# Patient Record
Sex: Female | Born: 1965 | Race: White | Hispanic: No | Marital: Married | Smoking: Never smoker
Health system: Southern US, Community
[De-identification: ages and names within clinical notes are randomized; demographics above are authoritative.]

## PROBLEM LIST (undated history)

## (undated) DIAGNOSIS — N920 Excessive and frequent menstruation with regular cycle: Secondary | ICD-10-CM

## (undated) HISTORY — PX: HYSTEROSCOPY: SHX211

## (undated) HISTORY — PX: DILATION AND CURETTAGE, DIAGNOSTIC / THERAPEUTIC: SUR384

## (undated) HISTORY — PX: LAPAROSCOPIC SUPRACERVICAL HYSTERECTOMY: SUR797

## (undated) HISTORY — DX: Excessive and frequent menstruation with regular cycle: N92.0

---

## 2005-12-21 ENCOUNTER — Emergency Department: Payer: Self-pay | Admitting: Emergency Medicine

## 2005-12-21 IMAGING — CR RIGHT ANKLE - COMPLETE 3+ VIEW
1 series · 5 of 5 positions shown · non-contrast
Comparison: none

REASON FOR EXAM: Fall
COMMENTS:

PROCEDURE:     DXR - DXR ANKLE RIGHT COMPLETE  - [DATE]  [DATE]
RESULT:          Views of the ankle reveal the joint mortise to be
preserved.  The talar dome is intact.  The medial, lateral, and posterior
malleoli appear intact.  There is soft tissue swelling laterally.

[Series 1: view not recorded · 0.17mm/px · 5 of 5 slices shown]
[im 1/5]
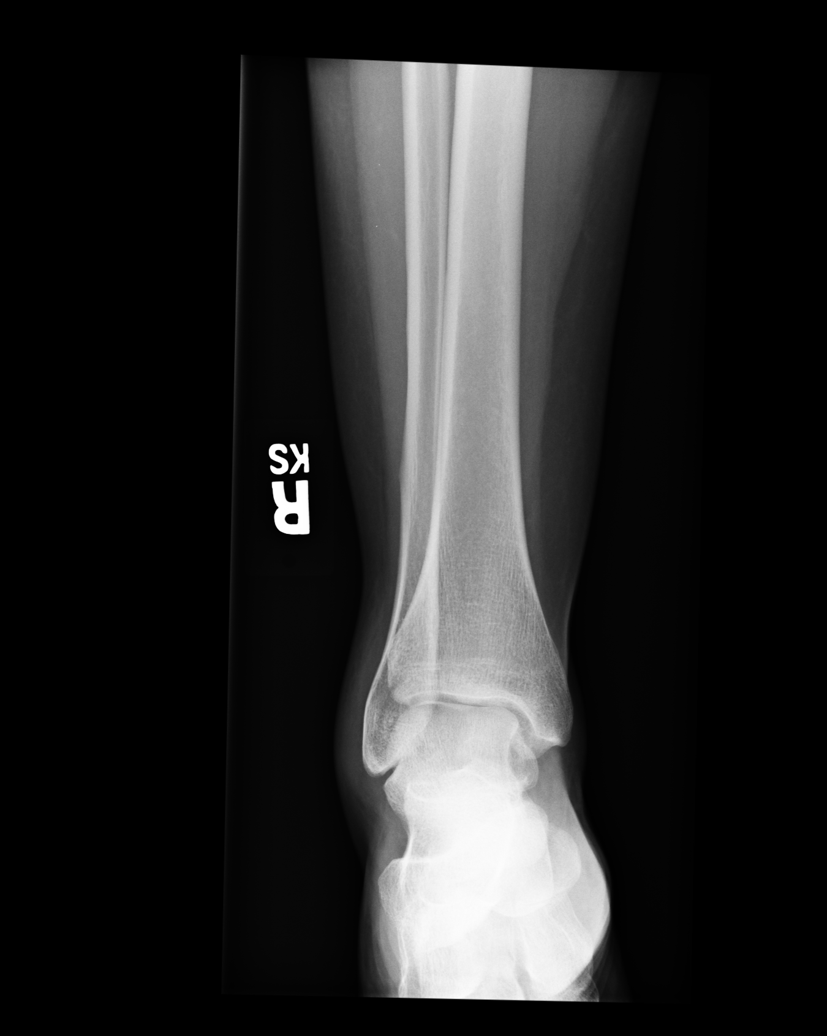
[im 2/5]
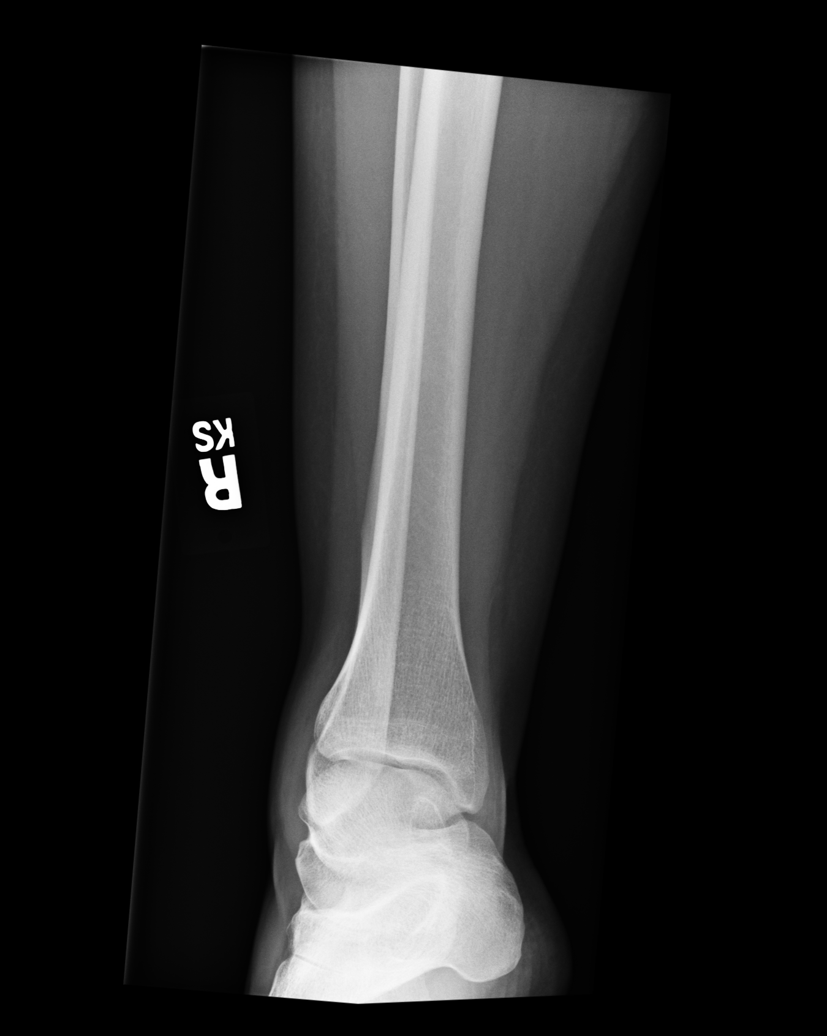
[im 3/5]
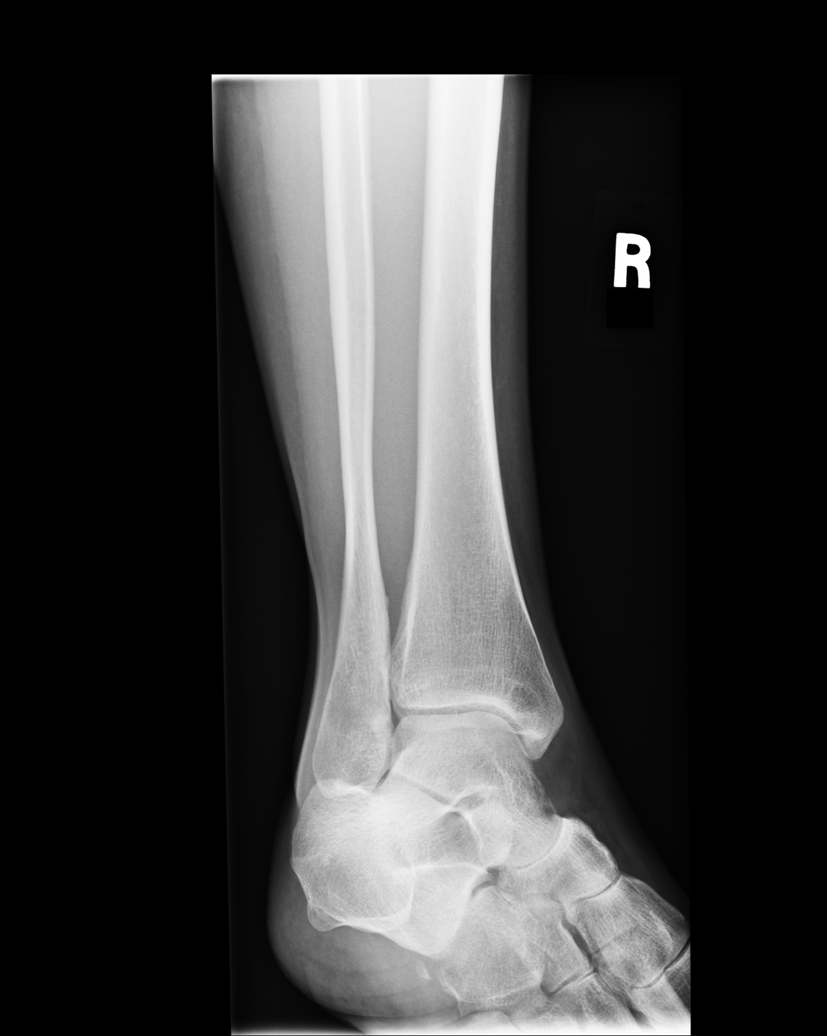
[im 4/5]
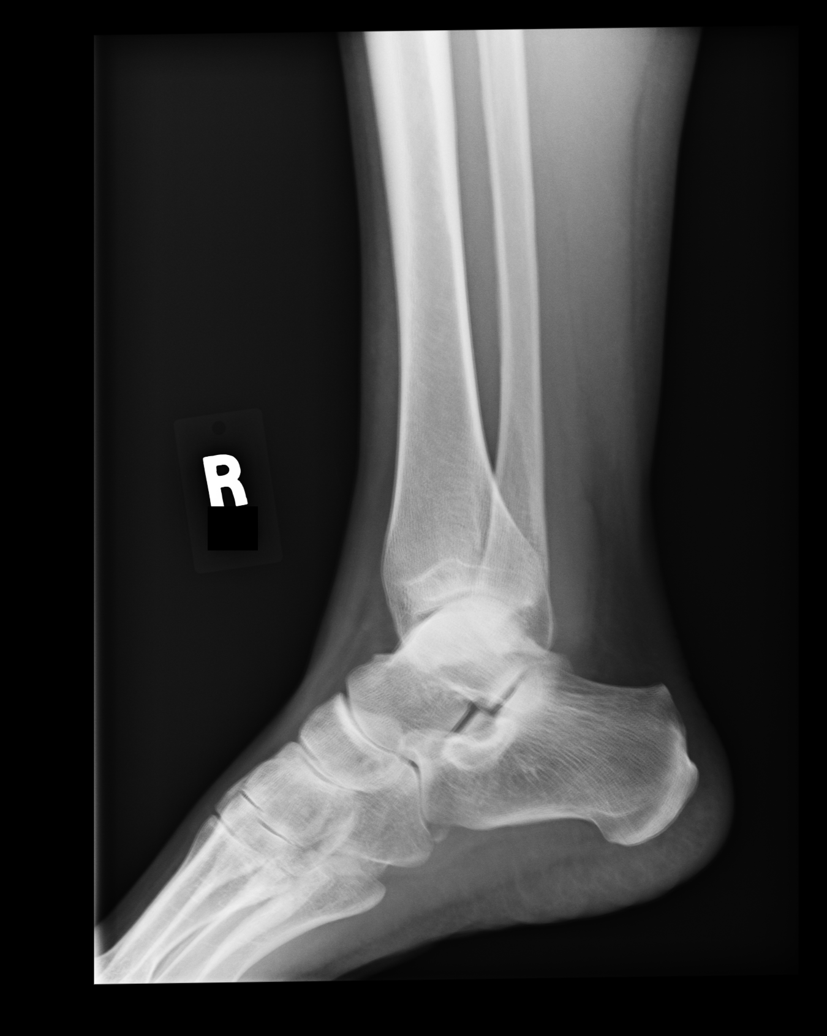
[im 5/5]
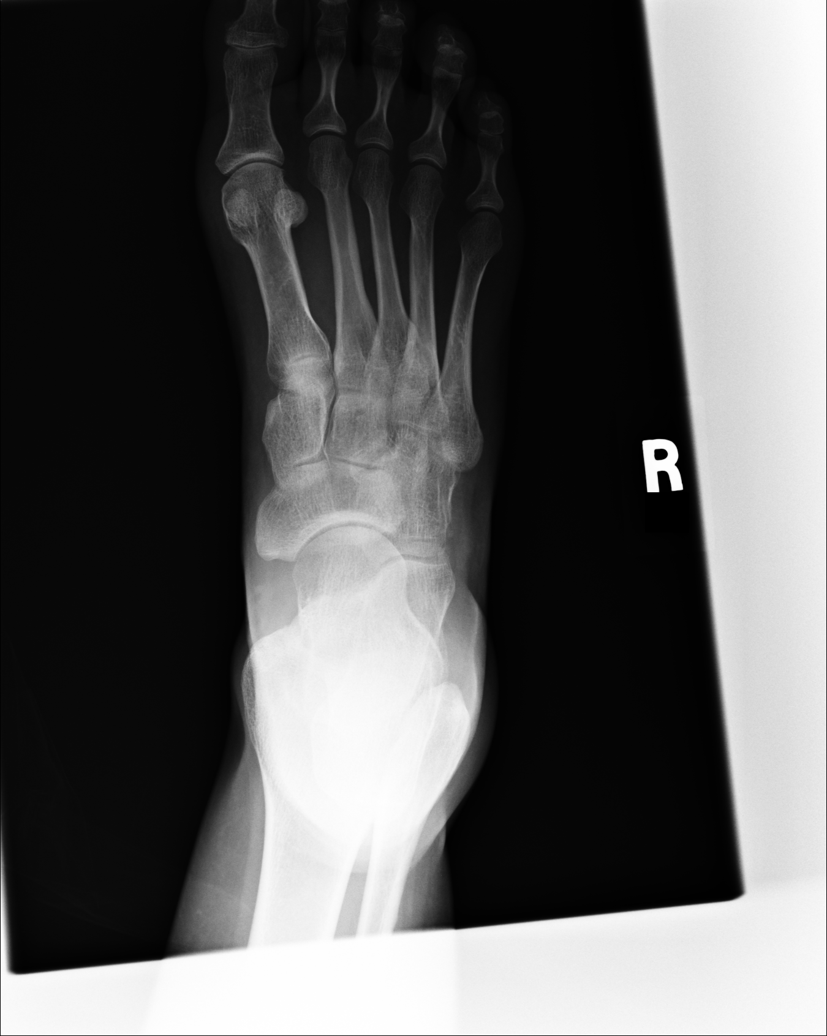

[5 of 5 positions shown; findings below may reference images not displayed]

IMPRESSION: There is evidence of soft tissue injury over the
lateral malleolus, but I do not see evidence of an acute fracture.

## 2006-12-02 ENCOUNTER — Ambulatory Visit: Payer: Self-pay

## 2007-04-02 ENCOUNTER — Ambulatory Visit: Payer: Self-pay

## 2007-04-10 ENCOUNTER — Ambulatory Visit: Payer: Self-pay

## 2010-05-30 ENCOUNTER — Ambulatory Visit: Payer: Self-pay

## 2011-06-04 ENCOUNTER — Ambulatory Visit: Payer: Self-pay

## 2011-06-04 IMAGING — MG MAM DGTL SCREENING MAMMO W/CAD
1 series · 4 of 4 positions shown · non-contrast
Comparison: none

REASON FOR EXAM: scr
COMMENTS:

[Series 2219: R CC · right · 4 of 4 slices shown]
[im 1/4]
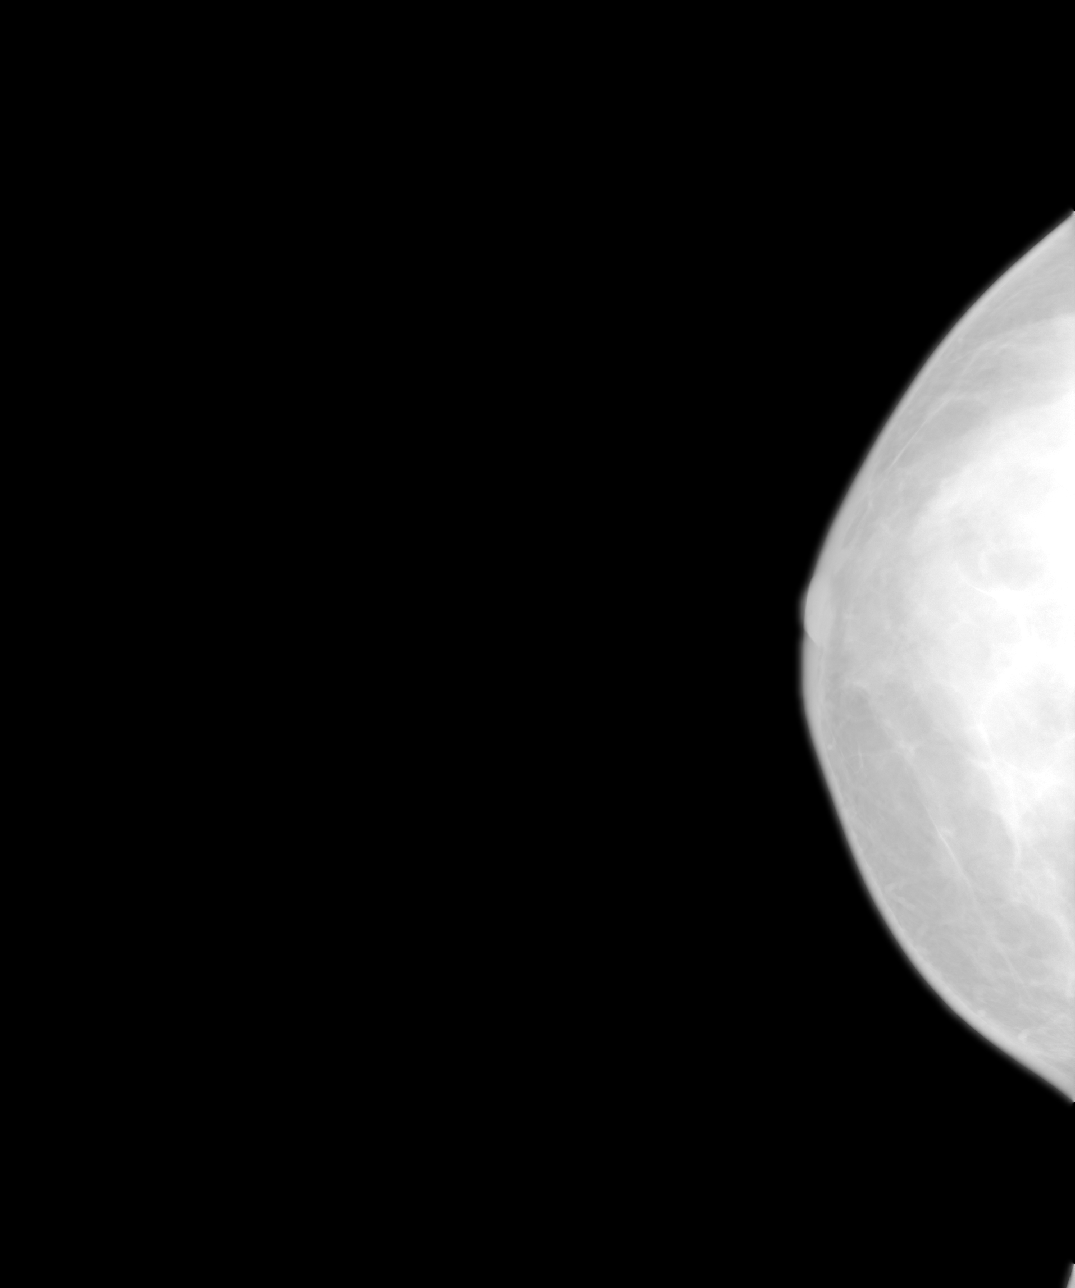
[im 2/4]
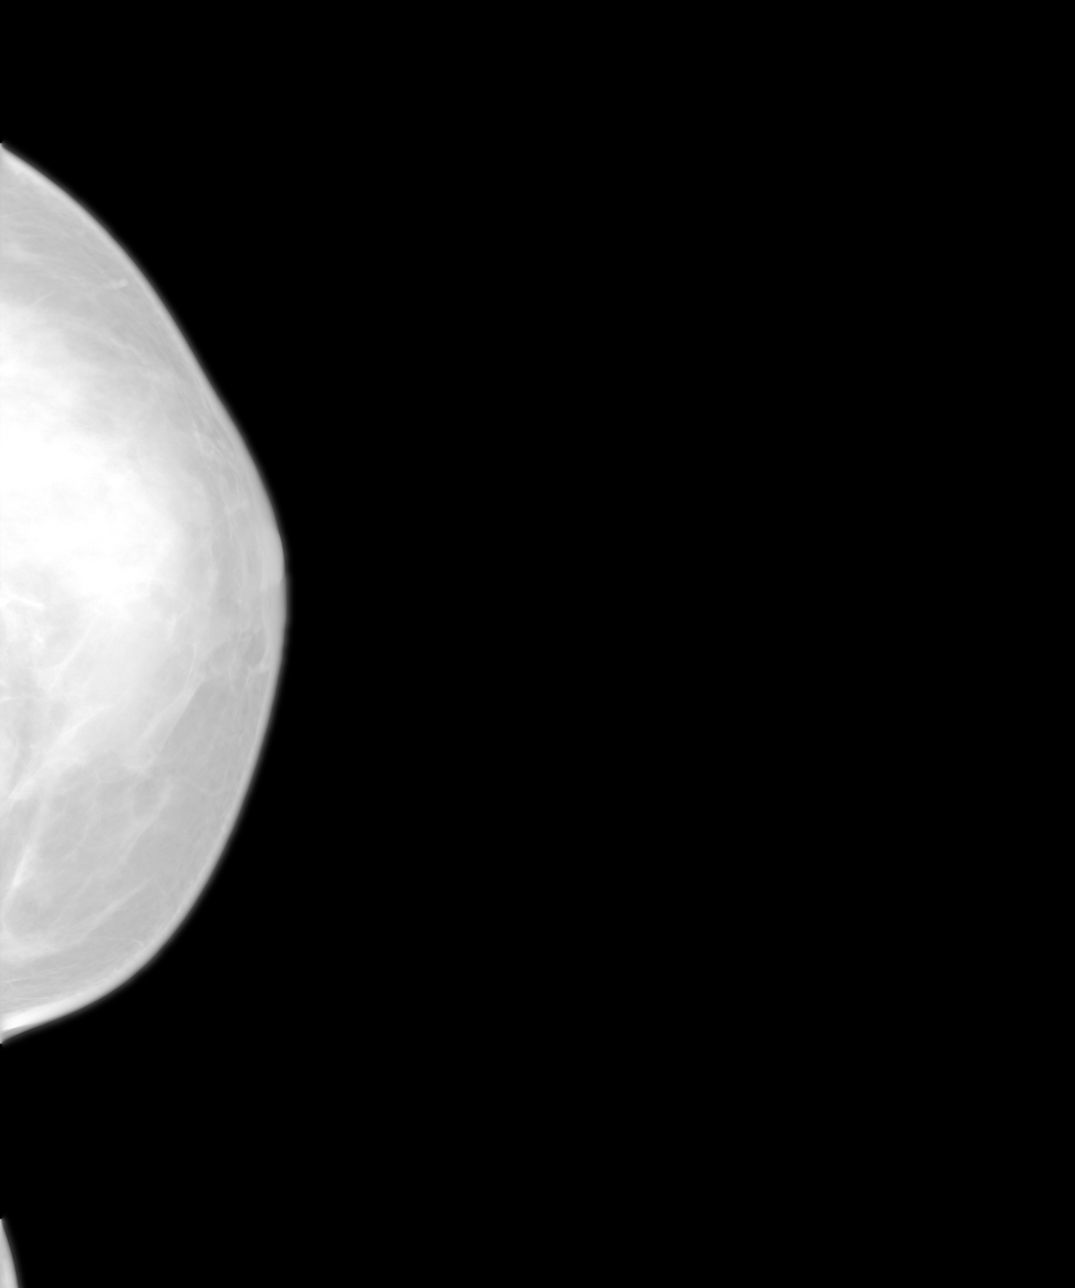
[im 3/4]
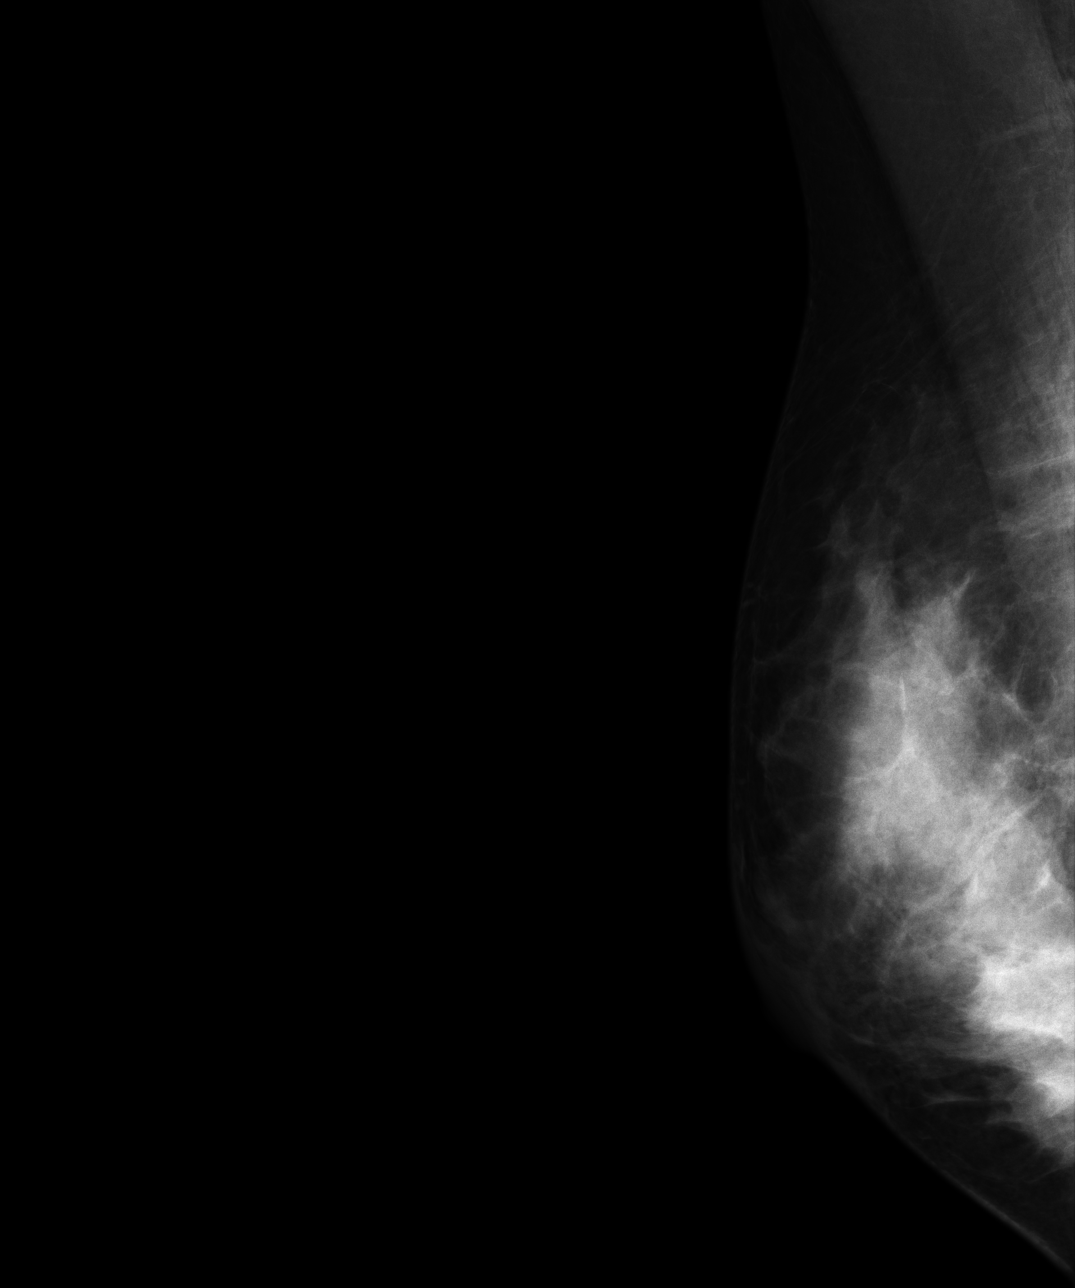
[im 4/4]
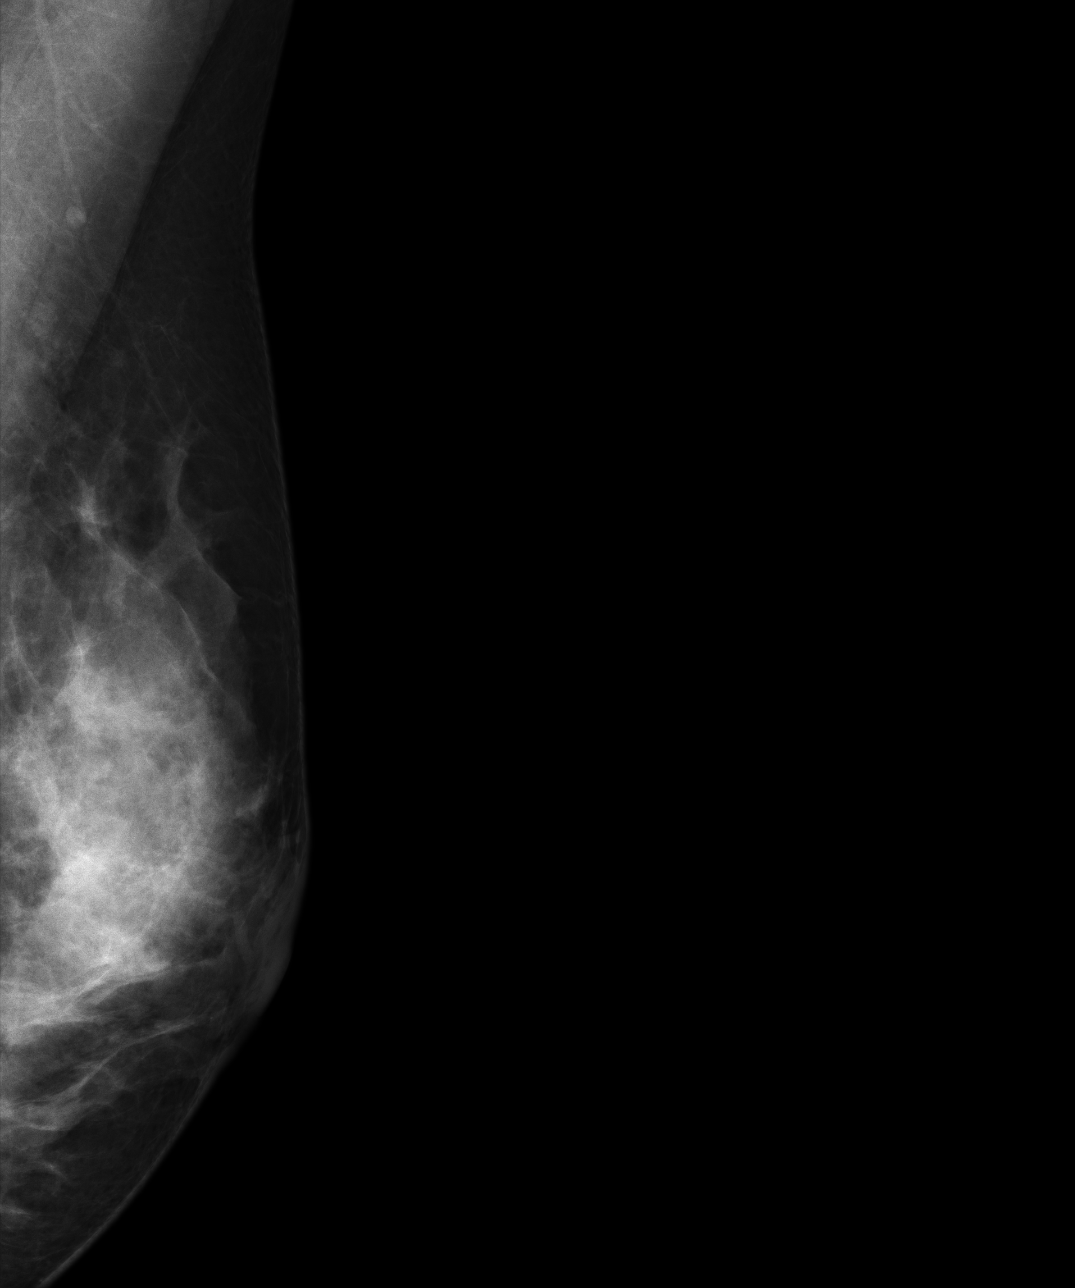

[4 of 4 positions shown; findings below may reference images not displayed]

PROCEDURE:     MAM - MAM DGTL SCREENING MAMMO W/CAD  - [DATE]  [DATE]

RESULT:     Comparison is made to the study [DATE],[DATE],
and [DATE].

The breasts exhibit a dense parenchymal pattern. There is no dominant mass.
There are no malignant appearing groupings of microcalcification. No area of
new architectural distortion is seen.
IMPRESSION: I do not see findings suspicious for malignancy.

BI-RADS 2

Recommendation: Please continue to encourage yearly mammographic followup.

A negative mammographic report should not preclude biopsy of clinically
palpable or otherwise suspicious lesions.

## 2015-07-28 ENCOUNTER — Other Ambulatory Visit: Payer: Self-pay | Admitting: Unknown Physician Specialty

## 2015-07-28 DIAGNOSIS — R928 Other abnormal and inconclusive findings on diagnostic imaging of breast: Secondary | ICD-10-CM

## 2015-08-04 ENCOUNTER — Ambulatory Visit
Admission: RE | Admit: 2015-08-04 | Discharge: 2015-08-04 | Disposition: A | Discharge status: Home / Self Care | Payer: Commercial Managed Care - HMO | Source: Ambulatory Visit | Attending: Unknown Physician Specialty | Admitting: Unknown Physician Specialty

## 2015-08-04 DIAGNOSIS — R928 Other abnormal and inconclusive findings on diagnostic imaging of breast: Secondary | ICD-10-CM | POA: Diagnosis present

## 2015-08-04 IMAGING — MG MM DIAG BREAST TOMO UNI LEFT
6 series · 6 of 14 positions shown · non-contrast
Comparison: [DATE], [DATE], [DATE], [DATE]

CLINICAL DATA: 49-year-old female, callback from screening
mammogram for possible left breast asymmetry

EXAM:
DIGITAL DIAGNOSTIC LEFT MAMMOGRAM WITH 3D TOMOSYNTHESIS WITH CAD
ULTRASOUND LEFT BREAST

[L CC synth-2D]
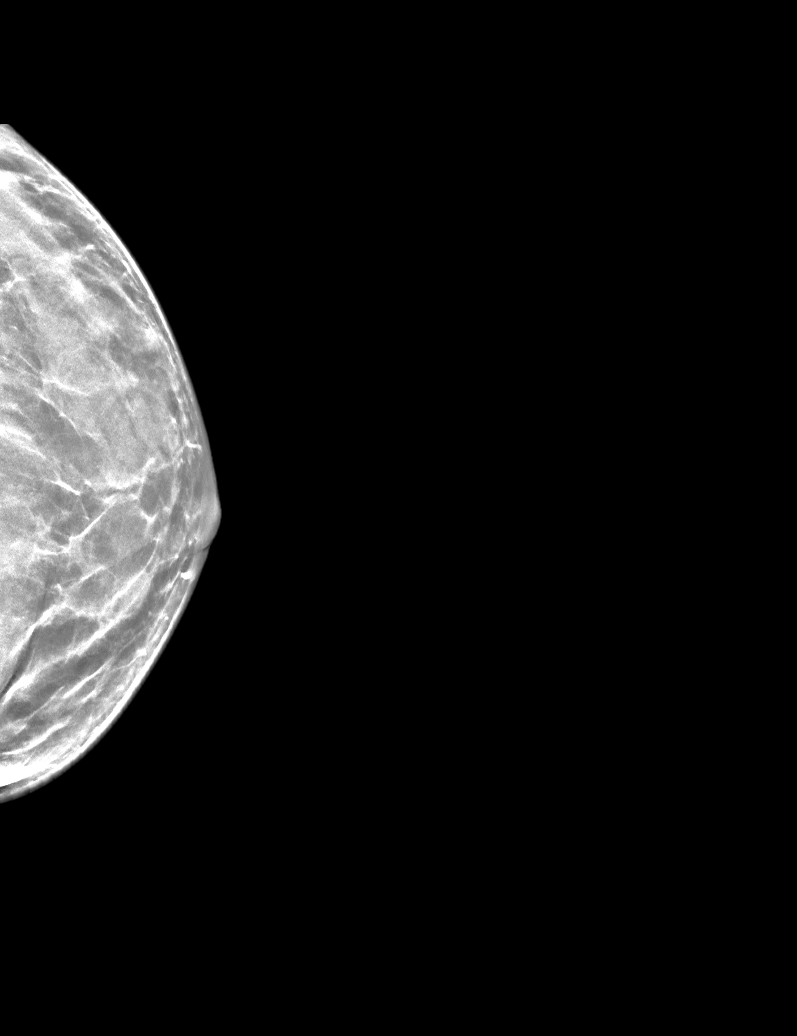

[L MLO]
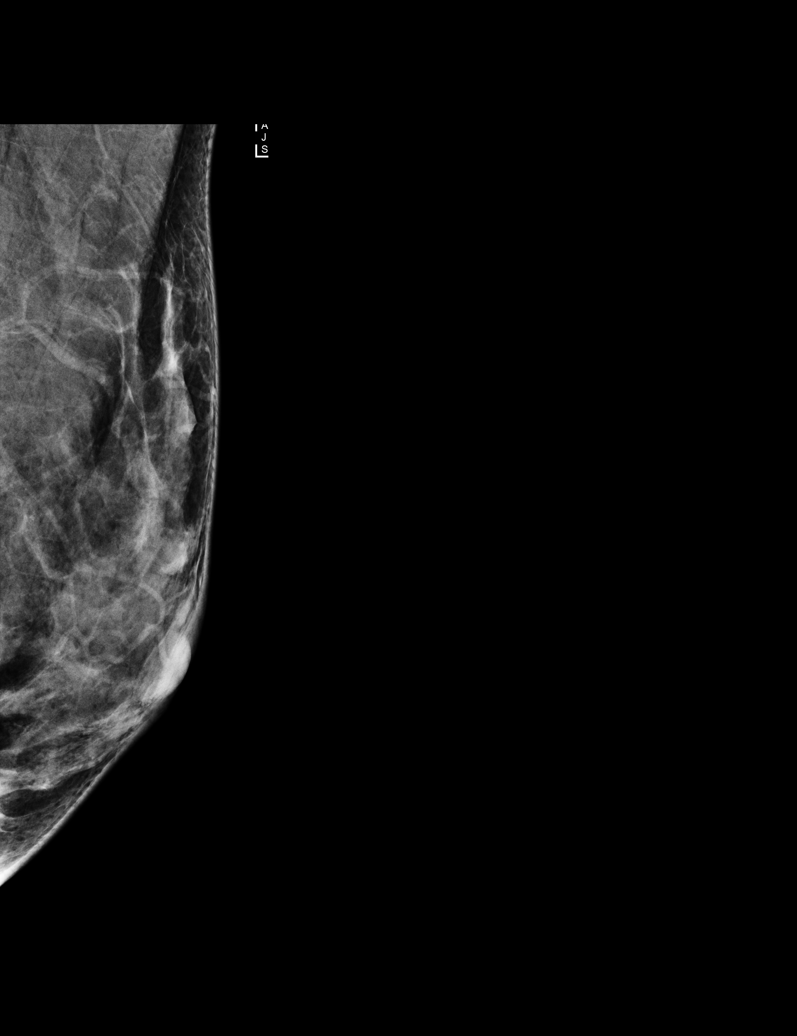

[L CC]
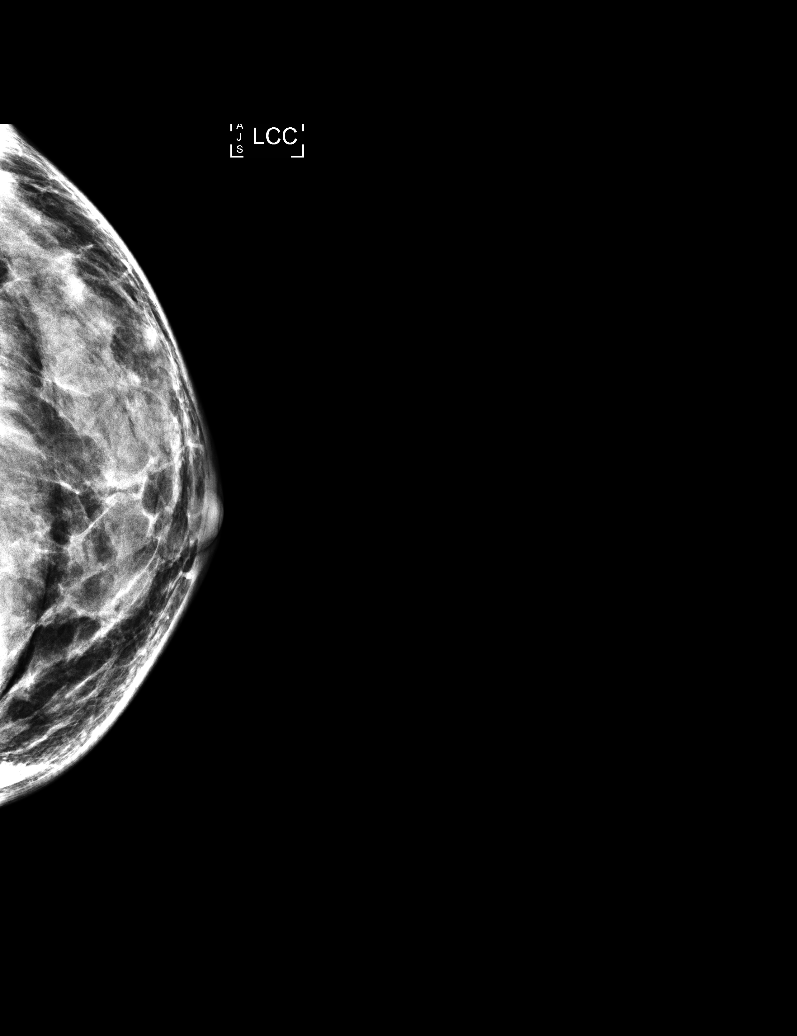

[L MLO synth-2D]
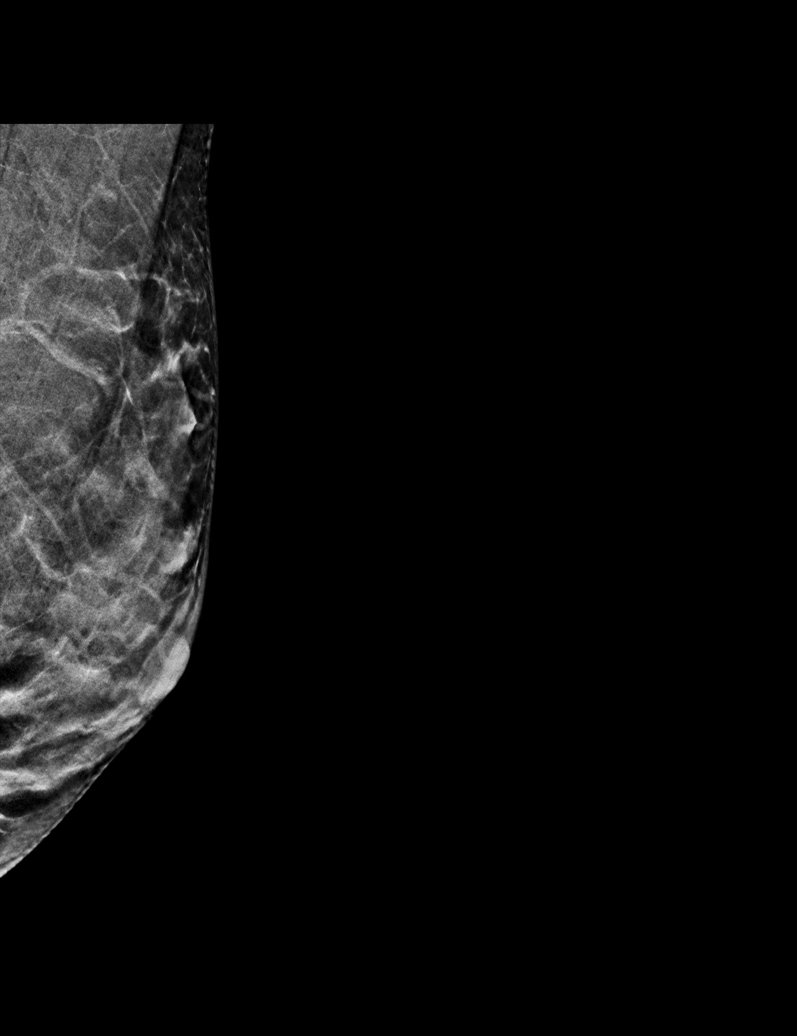

[L MLO tomo · tomo slice 21/40.0]
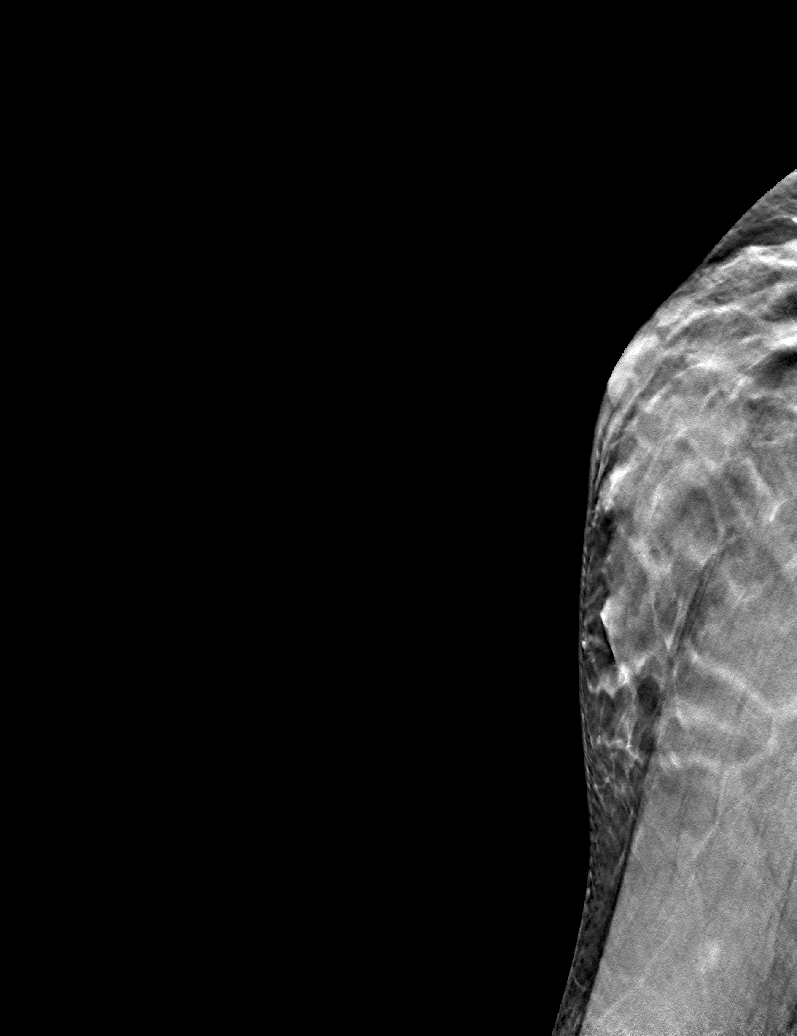

[L CC tomo · tomo slice 23/46.0]
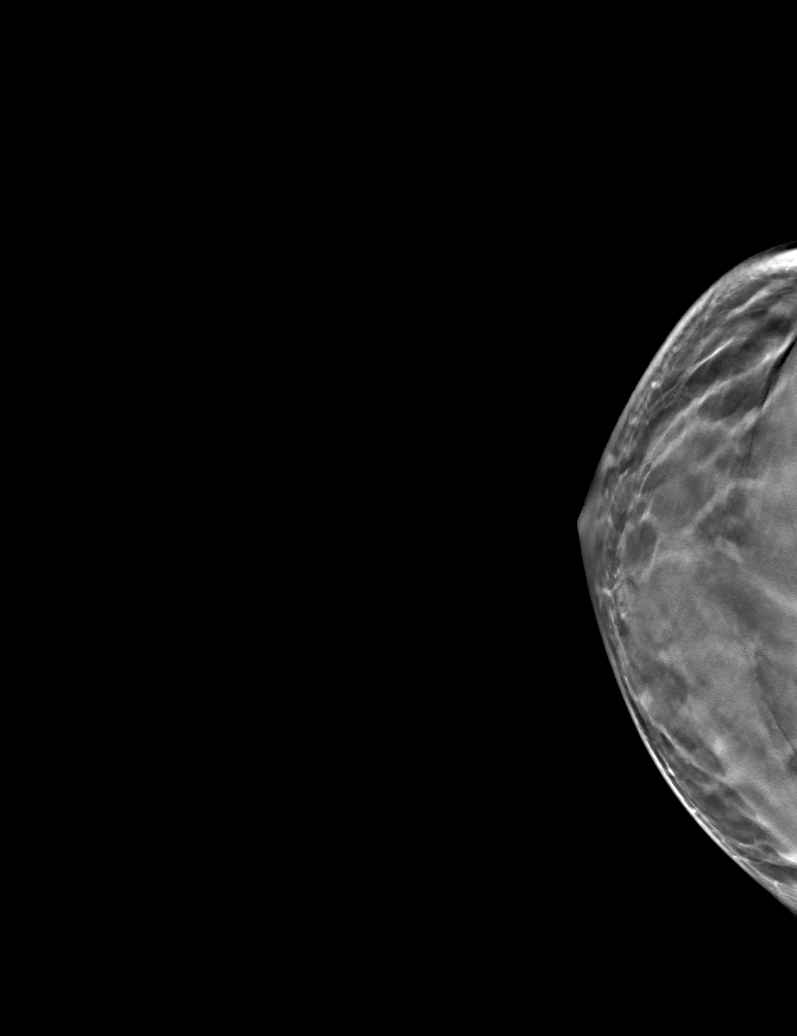

[6 of 14 positions shown; findings below may reference images not displayed]

ACR Breast Density Category d: The breast tissue is extremely dense,
which lowers the sensitivity of mammography.
FINDINGS: CC and MLO views of the left breast with tomosynthesis were
performed. On the additional views, there is no definite mass in the
area of concern identified on screening mammogram. This area is
thought to likely represent normal dense breast tissue and is
similar in appearance to the patient's [71] exam.

Mammographic images were processed with CAD.

On physical exam, no discrete mass is felt in the area of concern
within the superior left breast.

Targeted ultrasound of the upper, outer and upper, inner left breast
was performed demonstrating no suspicious cystic or solid
sonographic finding in the area of concern.
IMPRESSION: No mammographic or sonographic evidence of malignancy.

RECOMMENDATION:
Screening mammogram in one year.(Code:[71])

I have discussed the findings and recommendations with the patient.
Results were also provided in writing at the conclusion of the
visit. If applicable, a reminder letter will be sent to the patient
regarding the next appointment.

BI-RADS CATEGORY  1: Negative.

## 2016-08-16 ENCOUNTER — Other Ambulatory Visit: Payer: Self-pay | Admitting: Obstetrics & Gynecology

## 2016-08-16 DIAGNOSIS — Z1231 Encounter for screening mammogram for malignant neoplasm of breast: Secondary | ICD-10-CM

## 2016-08-23 LAB — HM MAMMOGRAPHY

## 2016-08-23 LAB — HM PAP SMEAR: HM Pap smear: NEGATIVE

## 2016-08-24 ENCOUNTER — Ambulatory Visit
Admission: RE | Admit: 2016-08-24 | Discharge: 2016-08-24 | Disposition: A | Discharge status: Home / Self Care | Payer: Commercial Managed Care - HMO | Source: Ambulatory Visit | Attending: Obstetrics & Gynecology | Admitting: Obstetrics & Gynecology

## 2016-08-24 DIAGNOSIS — Z1231 Encounter for screening mammogram for malignant neoplasm of breast: Secondary | ICD-10-CM | POA: Diagnosis present

## 2016-08-24 IMAGING — MG 2D DIGITAL SCREENING BILATERAL MAMMOGRAM WITH CAD AND ADJUNCT TO
9 of 12 series · 9 of 28 positions shown · non-contrast
Comparison: Previous exam(s).

CLINICAL DATA: Screening.

EXAM:
2D DIGITAL SCREENING BILATERAL MAMMOGRAM WITH CAD AND ADJUNCT TOMO

[R CC]
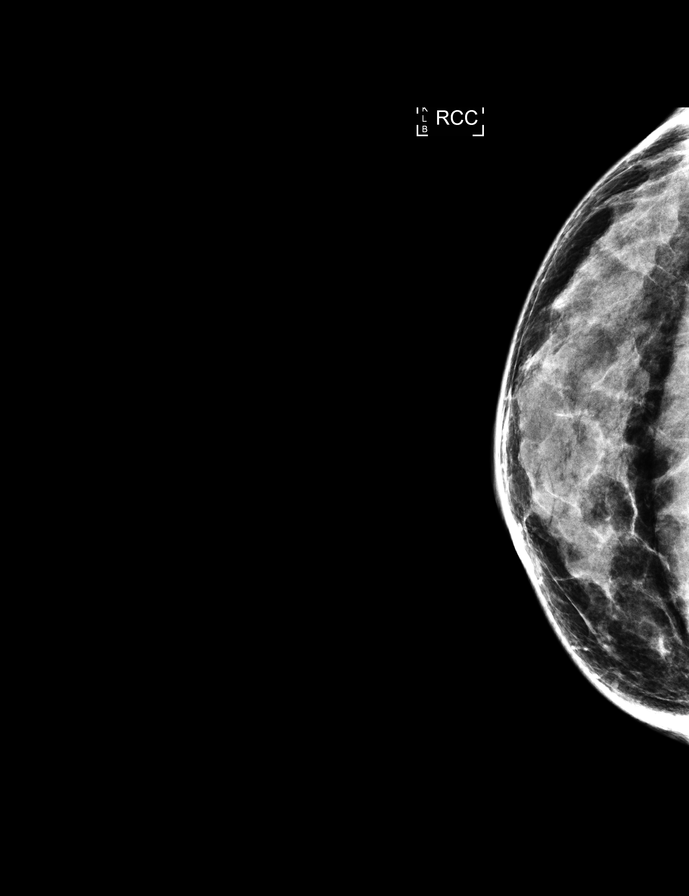

[L CC synth-2D]
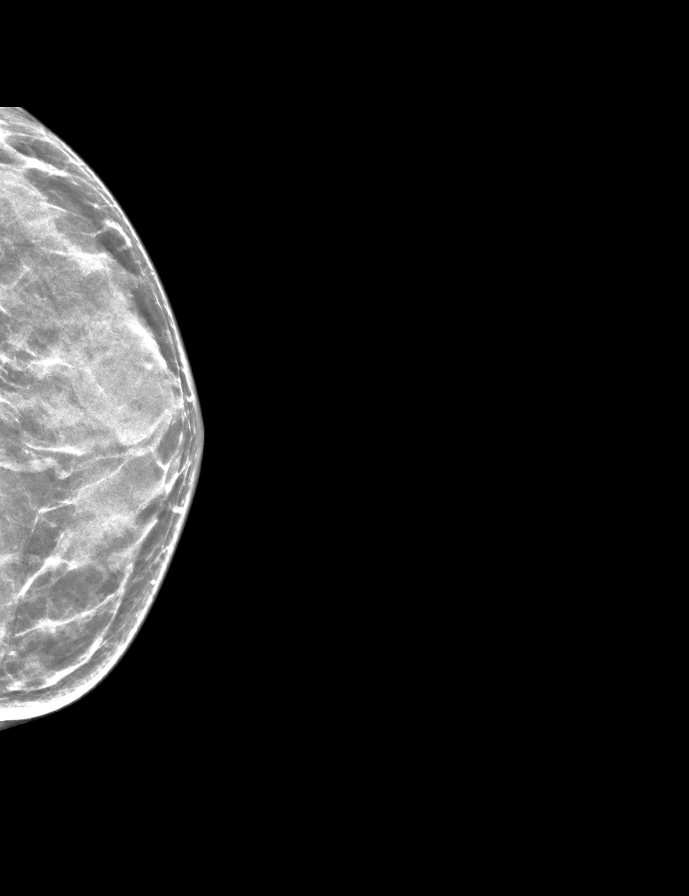

[R MLO]
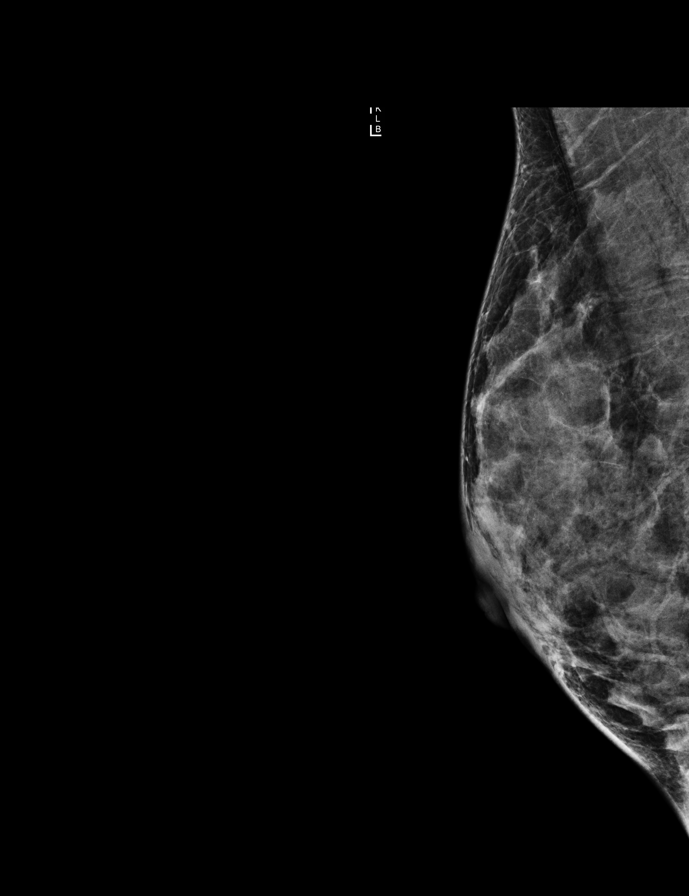

[L MLO]
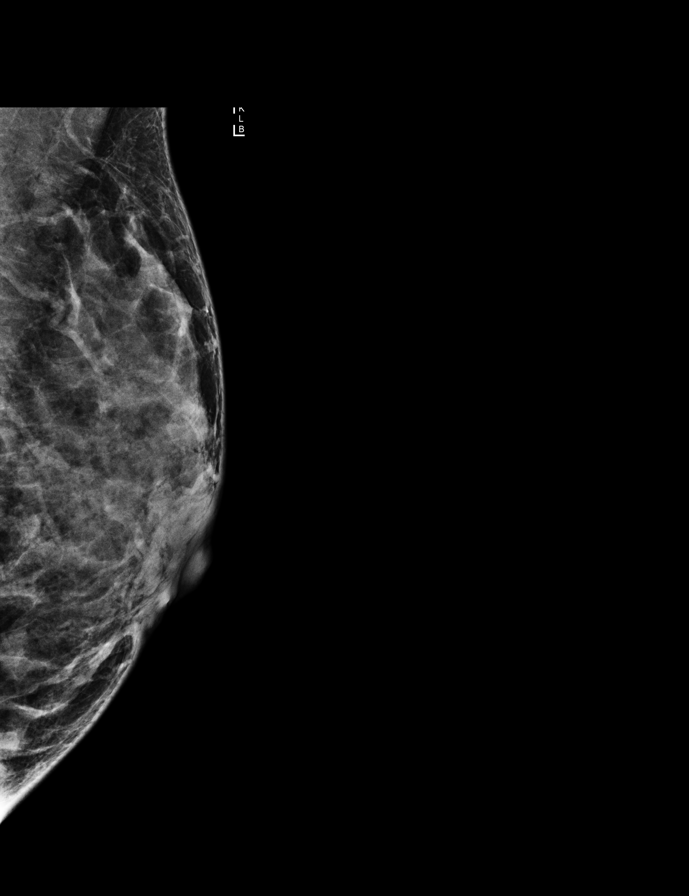

[R MLO synth-2D]
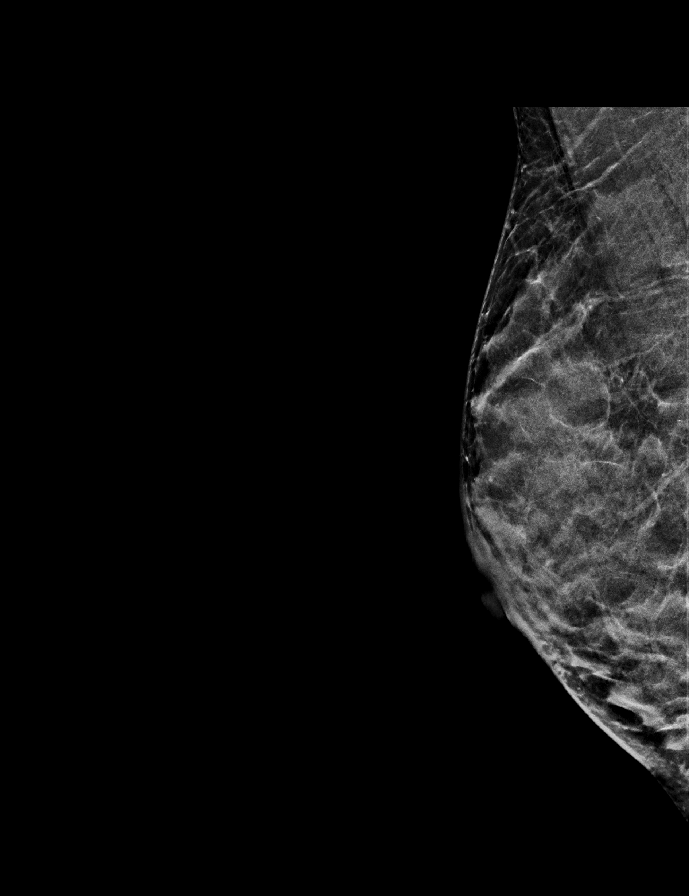

[L MLO synth-2D]
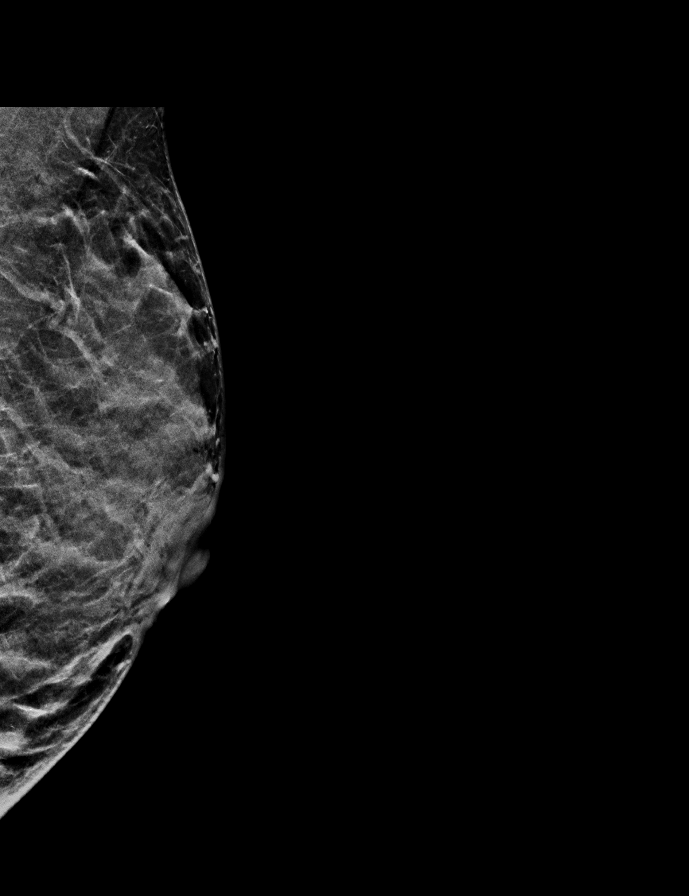

[L CC]
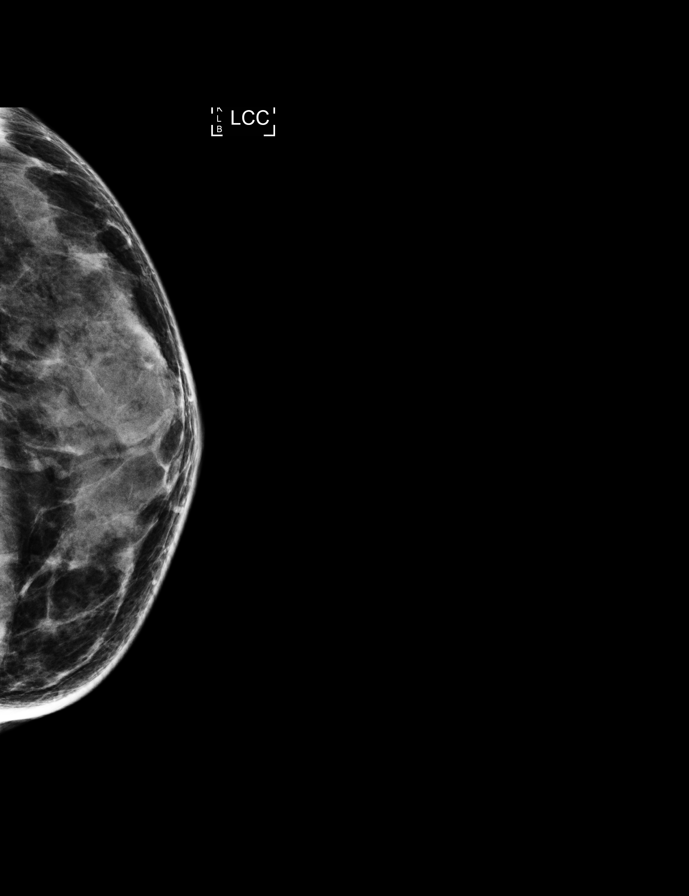

[R CC synth-2D]
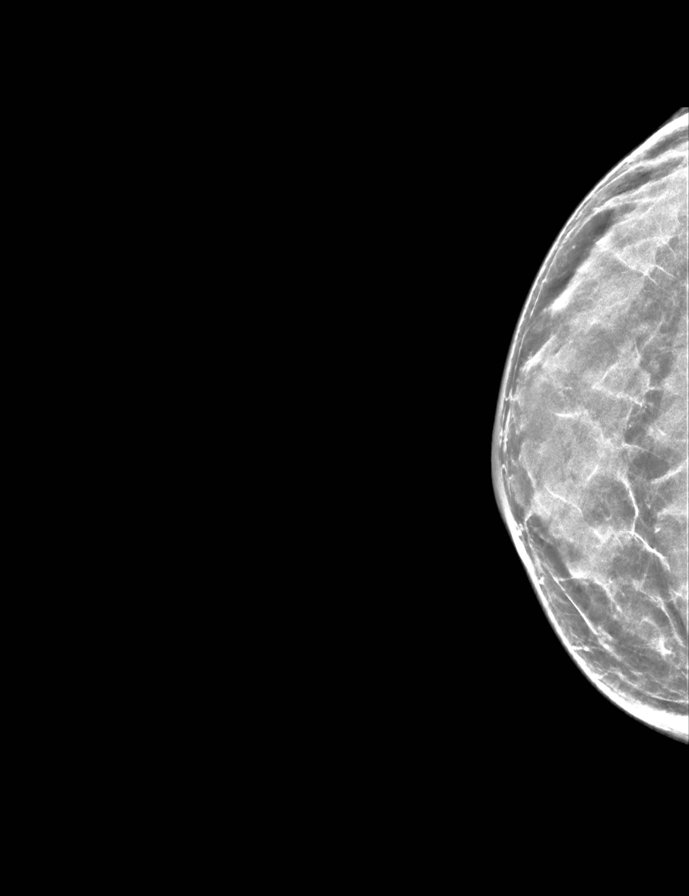

[R CC tomo · tomo slice 23/45.0]
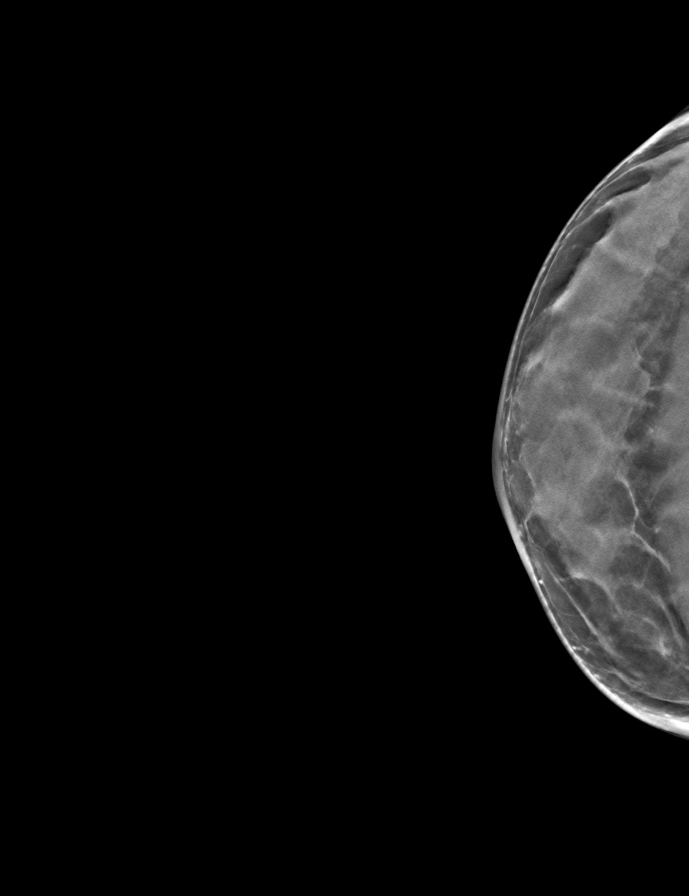

[9 of 28 positions shown; findings below may reference images not displayed]

ACR Breast Density Category d: The breast tissue is extremely dense,
which lowers the sensitivity of mammography.
FINDINGS: There are no findings suspicious for malignancy. Images were
processed with CAD.
IMPRESSION: No mammographic evidence of malignancy. A result letter of this
screening mammogram will be mailed directly to the patient.

RECOMMENDATION:
Screening mammogram in one year. (Code:[5L])

BI-RADS CATEGORY  1: Negative.

## 2017-09-03 ENCOUNTER — Other Ambulatory Visit: Payer: Self-pay | Admitting: Obstetrics & Gynecology

## 2017-09-03 DIAGNOSIS — Z1231 Encounter for screening mammogram for malignant neoplasm of breast: Secondary | ICD-10-CM

## 2017-09-16 ENCOUNTER — Ambulatory Visit
Admission: RE | Admit: 2017-09-16 | Discharge: 2017-09-16 | Disposition: A | Discharge status: Home / Self Care | Payer: Commercial Managed Care - HMO | Source: Ambulatory Visit | Attending: Obstetrics & Gynecology | Admitting: Obstetrics & Gynecology

## 2017-09-16 ENCOUNTER — Other Ambulatory Visit: Payer: Self-pay | Admitting: Obstetrics & Gynecology

## 2017-09-16 DIAGNOSIS — Z1231 Encounter for screening mammogram for malignant neoplasm of breast: Secondary | ICD-10-CM | POA: Diagnosis present

## 2017-09-16 DIAGNOSIS — R928 Other abnormal and inconclusive findings on diagnostic imaging of breast: Secondary | ICD-10-CM

## 2017-09-16 DIAGNOSIS — N6320 Unspecified lump in the left breast, unspecified quadrant: Secondary | ICD-10-CM

## 2017-09-16 IMAGING — MG 2D DIGITAL SCREENING BILATERAL MAMMOGRAM WITH CAD AND ADJUNCT TO
9 of 14 series · 9 of 30 positions shown · non-contrast
Comparison: Previous exam(s).

CLINICAL DATA: Screening.

EXAM:
2D DIGITAL SCREENING BILATERAL MAMMOGRAM WITH CAD AND ADJUNCT TOMO

[L CC synth-2D]
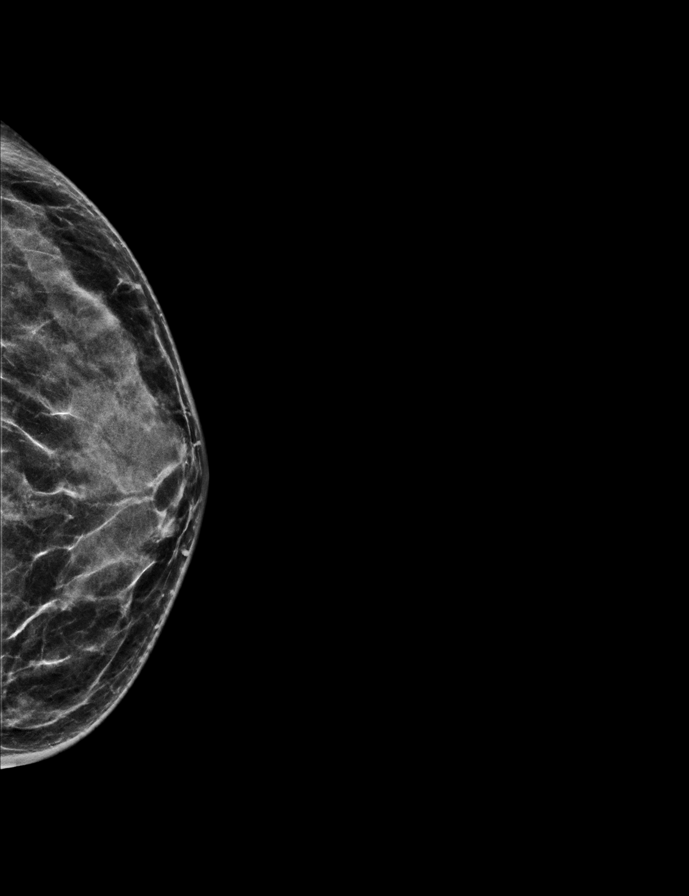

[L MLO synth-2D]
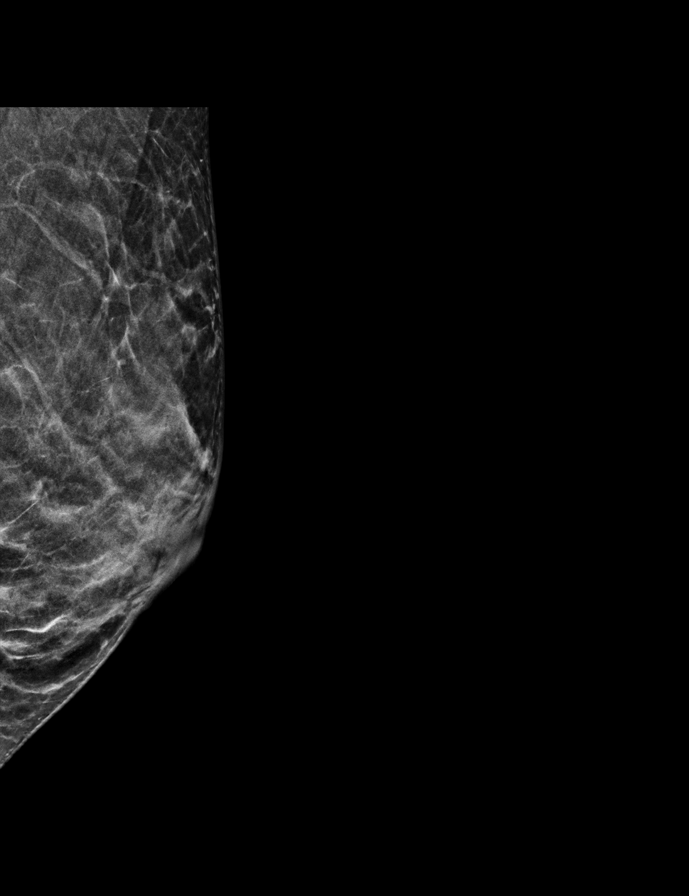

[R MLO synth-2D]
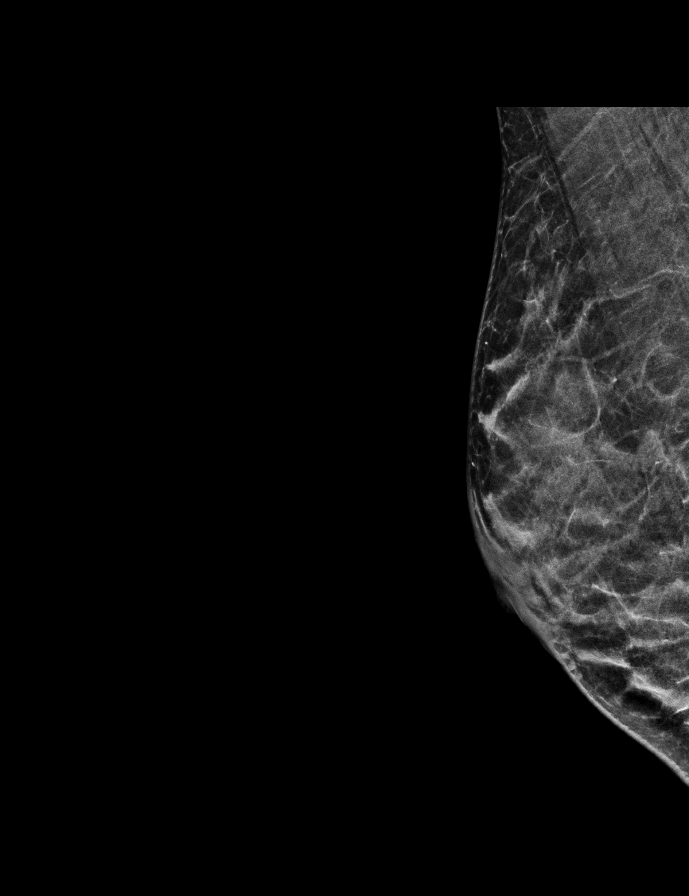

[L MLO (1 of 2)]
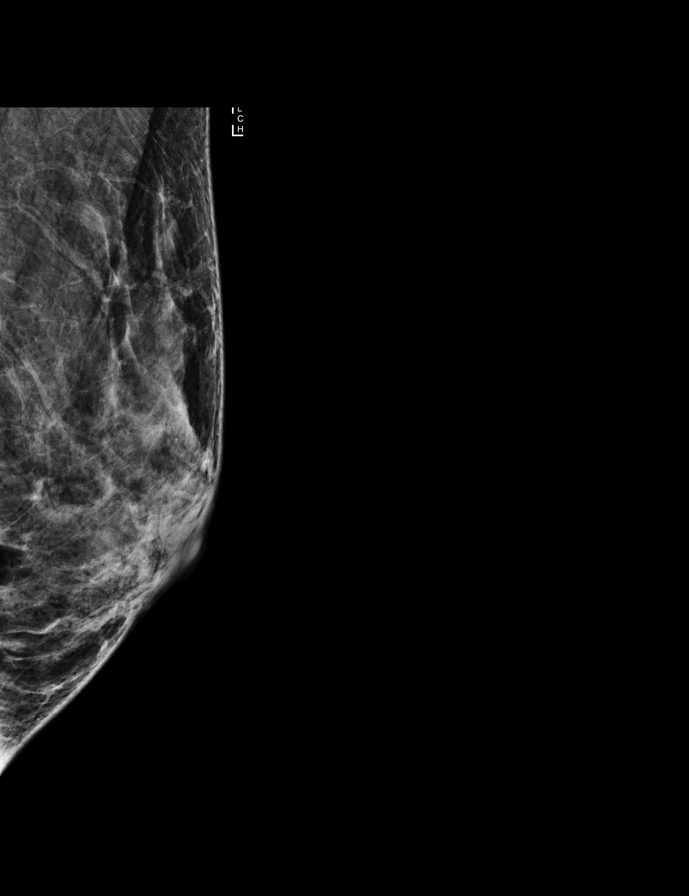

[R CC]
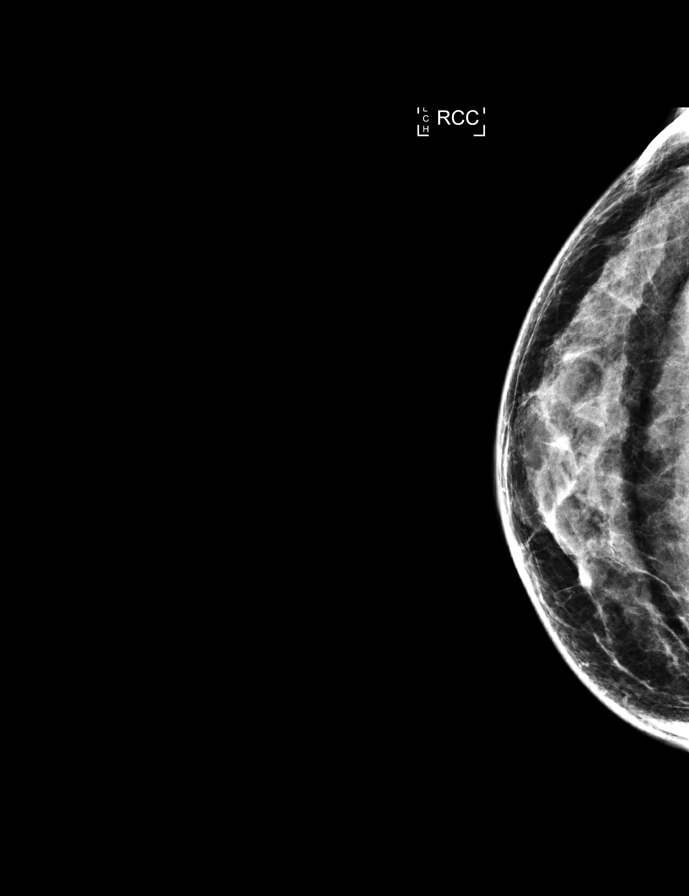

[L CC]
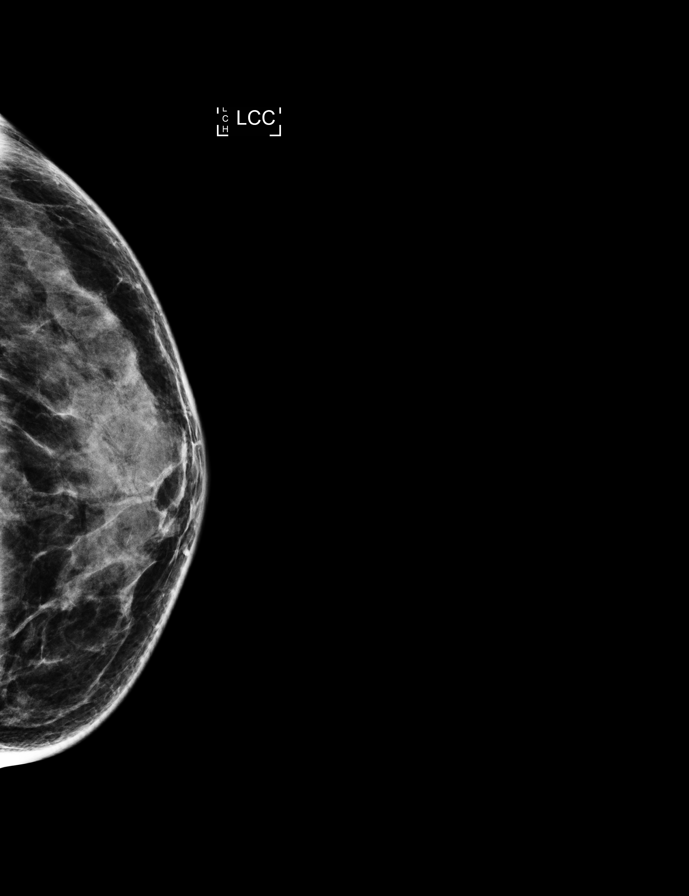

[R CC synth-2D]
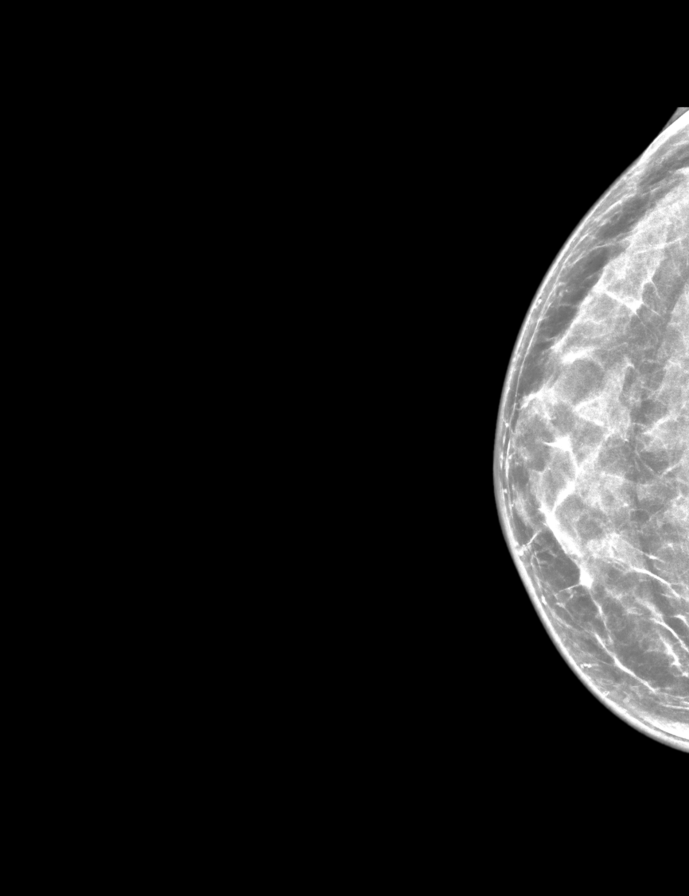

[R MLO]
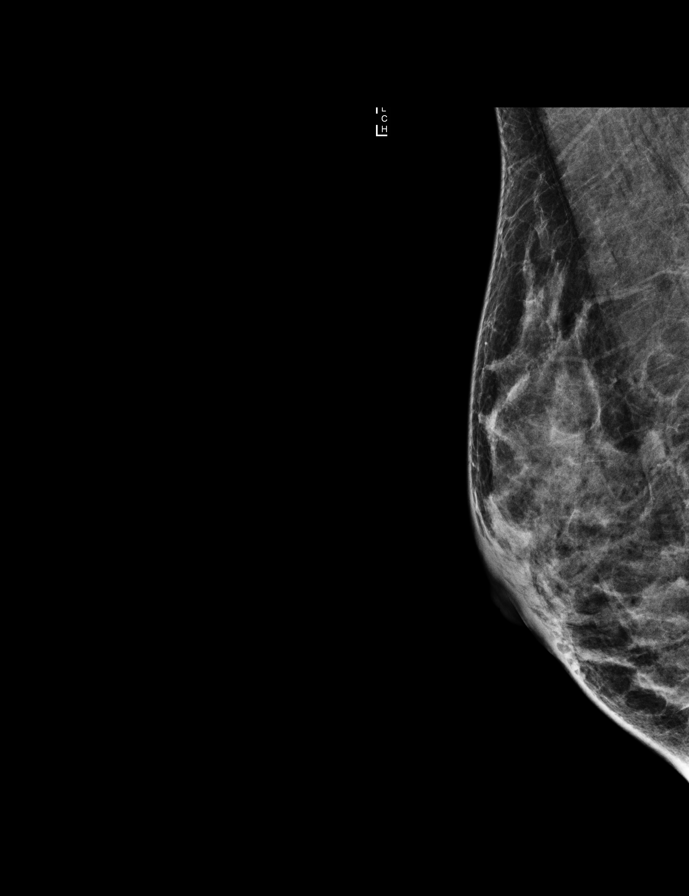

[L MLO (2 of 2)]
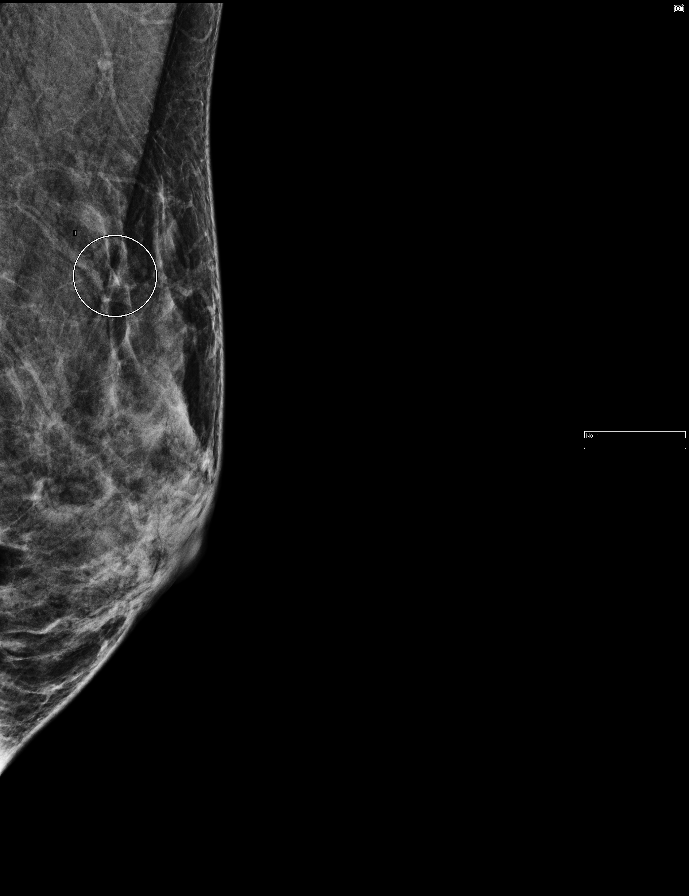

[9 of 30 positions shown; findings below may reference images not displayed]

ACR Breast Density Category d: The breast tissue is extremely dense,
which lowers the sensitivity of mammography.
FINDINGS: In the left breast, a possible mass warrants further evaluation. In
the right breast, no findings suspicious for malignancy.

Images were processed with CAD.
IMPRESSION: Further evaluation is suggested for possible mass in the left
breast.

RECOMMENDATION:
Diagnostic mammogram and possibly ultrasound of the left breast.
(Code:[5V])

The patient will be contacted regarding the findings, and additional
imaging will be scheduled.

BI-RADS CATEGORY  0: Incomplete. Need additional imaging evaluation
and/or prior mammograms for comparison.

## 2017-09-17 NOTE — Progress Notes (Signed)
To obtain additional views. Also, has Annual Dec 5th. Exam and discuss further at that time.

## 2017-09-27 ENCOUNTER — Ambulatory Visit
Admission: RE | Admit: 2017-09-27 | Discharge: 2017-09-27 | Disposition: A | Discharge status: Home / Self Care | Payer: Commercial Managed Care - HMO | Source: Ambulatory Visit | Attending: Obstetrics & Gynecology | Admitting: Obstetrics & Gynecology

## 2017-09-27 DIAGNOSIS — R928 Other abnormal and inconclusive findings on diagnostic imaging of breast: Secondary | ICD-10-CM

## 2017-09-27 DIAGNOSIS — N6489 Other specified disorders of breast: Secondary | ICD-10-CM | POA: Insufficient documentation

## 2017-09-27 DIAGNOSIS — N6320 Unspecified lump in the left breast, unspecified quadrant: Secondary | ICD-10-CM

## 2017-09-27 IMAGING — MG MM DIGITAL DIAGNOSTIC UNILAT*L* W/ TOMO W/ CAD
6 series · 6 of 14 positions shown · non-contrast
Comparison: Previous exam(s).

CLINICAL DATA: Screening recall for a left breast asymmetry.

EXAM:
2D DIGITAL DIAGNOSTIC UNILATERAL LEFT MAMMOGRAM WITH CAD AND ADJUNCT
TOMO

[L ML]
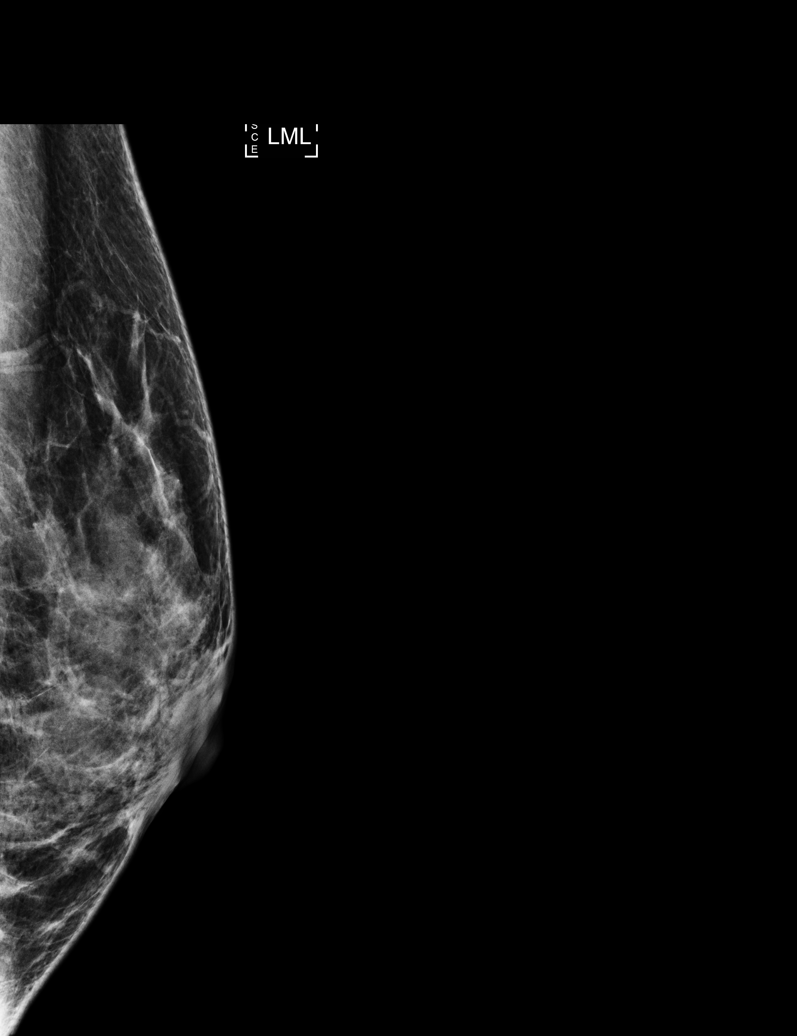

[L MLO]
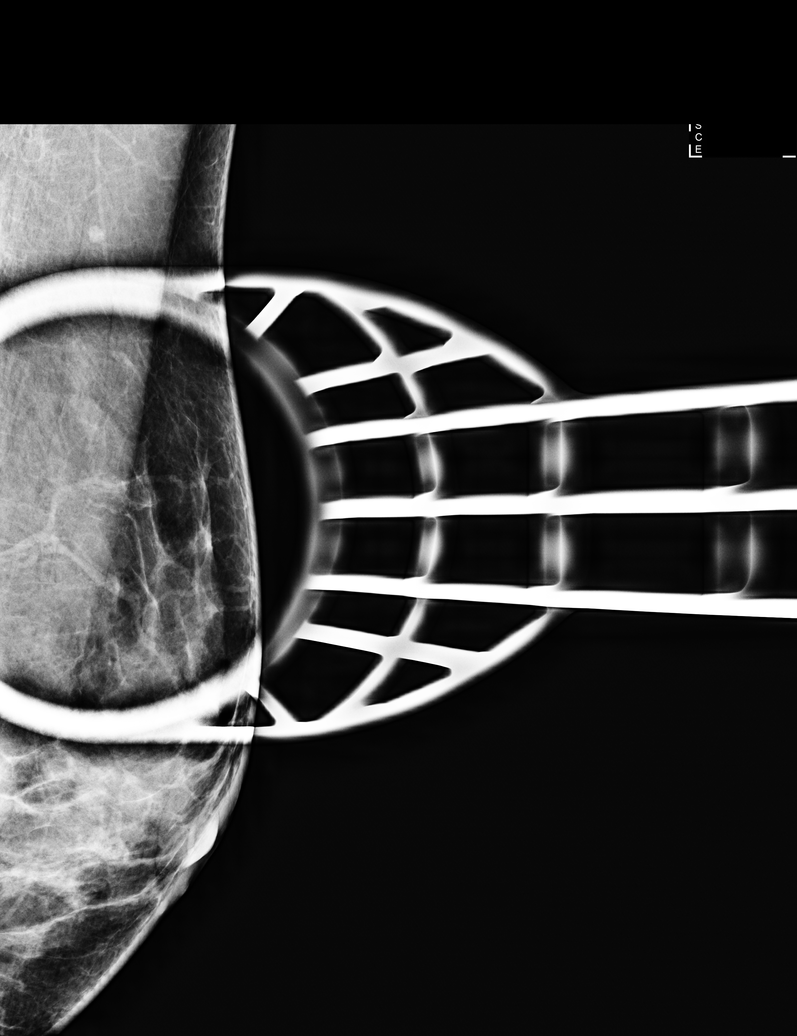

[L MLO synth-2D]
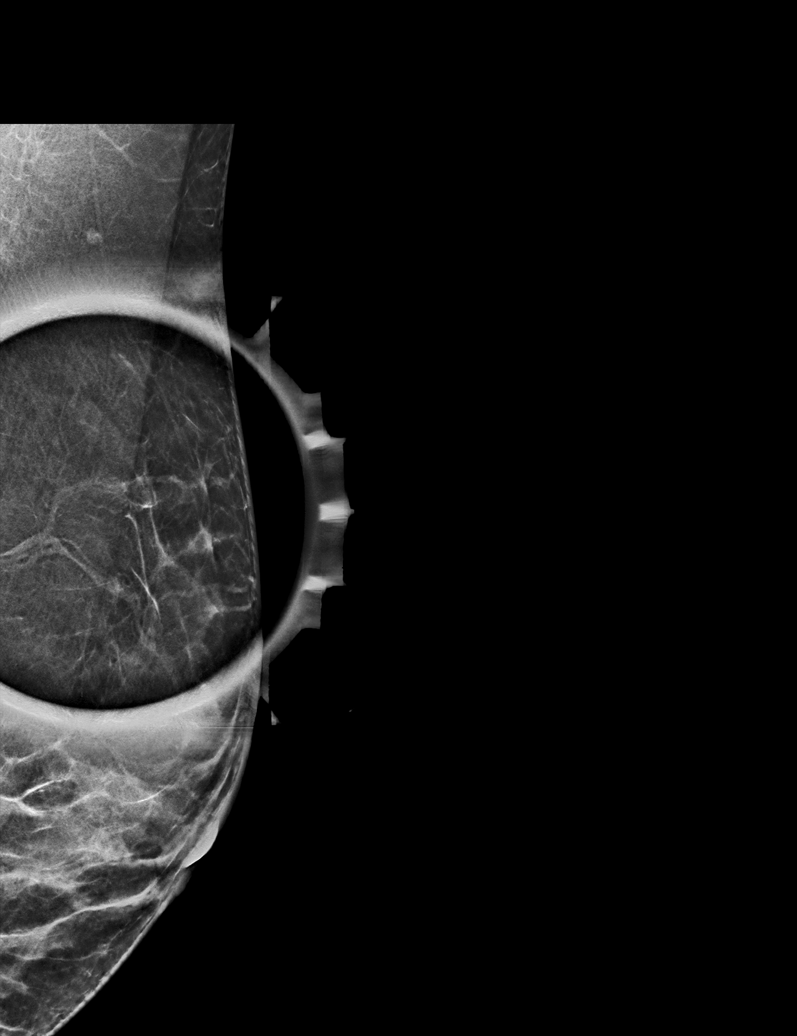

[L ML synth-2D]
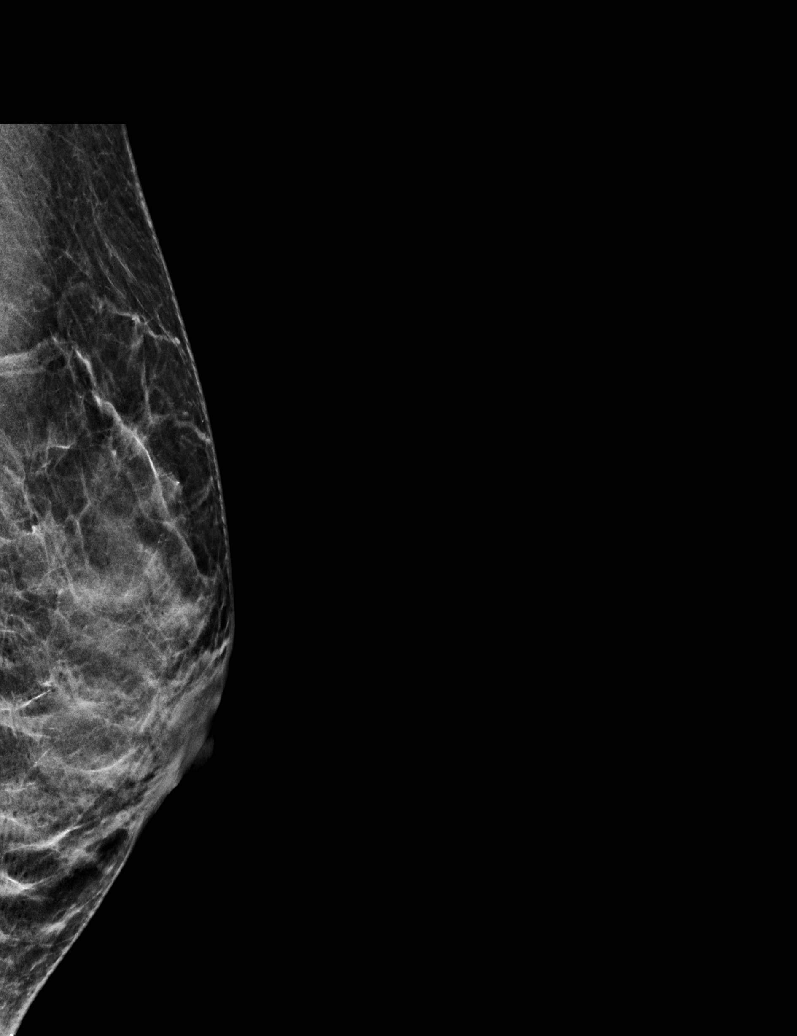

[L MLO tomo · tomo slice 21/40.0]
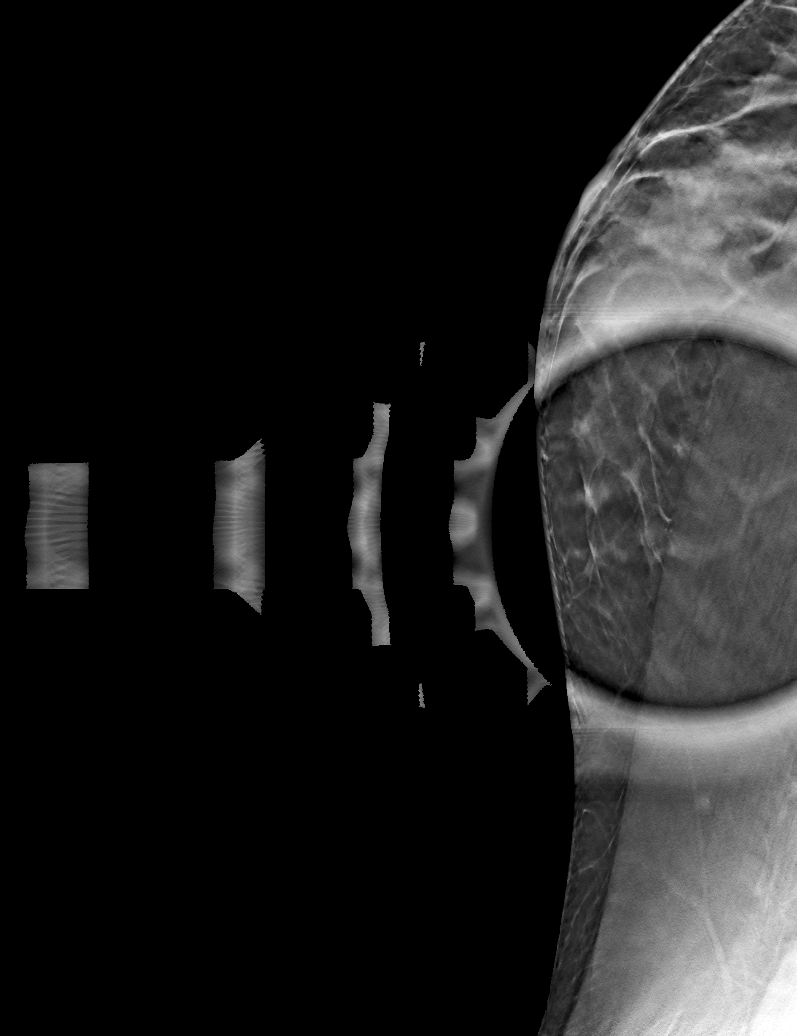

[L ML tomo · tomo slice 21/41.0]
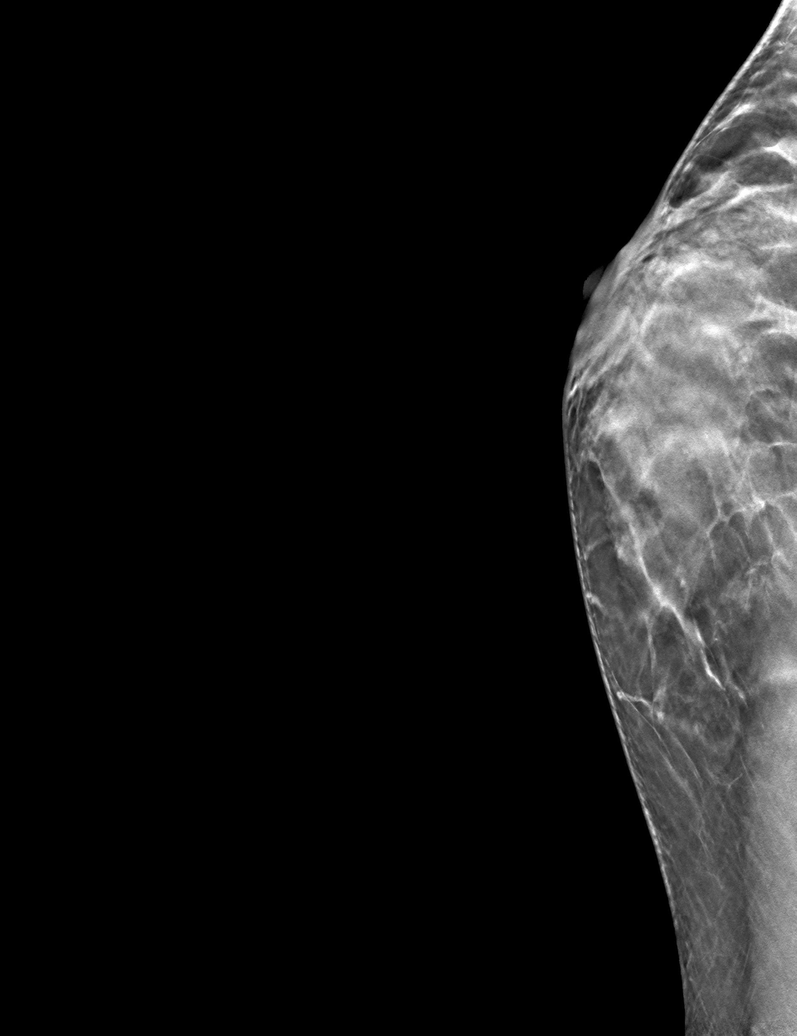

[6 of 14 positions shown; findings below may reference images not displayed]

ACR Breast Density Category c: The breast tissue is heterogeneously
dense, which may obscure small masses.
FINDINGS: The asymmetry identified in the superior aspect of the left breast
has resolved, likely representing overlapping fibroglandular tissue.
No suspicious calcifications, masses or areas of distortion are seen
in the left breast.

Mammographic images were processed with CAD.
IMPRESSION: Resolution of the left breast asymmetry consistent with overlapping
fibroglandular tissue.

RECOMMENDATION:
Screening mammogram in one year.(Code:[BS])

I have discussed the findings and recommendations with the patient.
Results were also provided in writing at the conclusion of the
visit. If applicable, a reminder letter will be sent to the patient
regarding the next appointment.

BI-RADS CATEGORY  2: Benign.

## 2017-10-02 ENCOUNTER — Ambulatory Visit: Payer: Self-pay | Admitting: Obstetrics & Gynecology

## 2017-11-05 ENCOUNTER — Encounter: Payer: Self-pay | Admitting: Obstetrics & Gynecology

## 2017-11-05 ENCOUNTER — Ambulatory Visit (INDEPENDENT_AMBULATORY_CARE_PROVIDER_SITE_OTHER): Payer: 59 | Admitting: Obstetrics & Gynecology

## 2017-11-05 VITALS — BP 100/60 | Ht 70.0 in | Wt 156.0 lb

## 2017-11-05 DIAGNOSIS — Z1211 Encounter for screening for malignant neoplasm of colon: Secondary | ICD-10-CM | POA: Diagnosis not present

## 2017-11-05 DIAGNOSIS — Z124 Encounter for screening for malignant neoplasm of cervix: Secondary | ICD-10-CM | POA: Diagnosis not present

## 2017-11-05 DIAGNOSIS — Z0000 Encounter for general adult medical examination without abnormal findings: Secondary | ICD-10-CM

## 2017-11-05 DIAGNOSIS — Z01419 Encounter for gynecological examination (general) (routine) without abnormal findings: Secondary | ICD-10-CM

## 2017-11-05 NOTE — Progress Notes (Signed)
HPI:      Karen Ryan is a 52 y.o. G1P1001 who LMP was in the past, she presents today for her annual examination.  The patient has no complaints today. The patient is sexually active. Herlast pap: approximate date 2017 and was normal and last mammogram: approximate date 07/2017 first abnormal then follow up done and was normal.  The patient does perform self breast exams.  There is no notable family history of breast or ovarian cancer in her family. The patient is not taking hormone replacement therapy. Patient denies post-menopausal vaginal bleeding.   The patient has regular exercise: yes. The patient denies current symptoms of depression.    GYN Hx: Last Colonoscopy:never ago.  Last DEXA: never ago.  Scheduled soon thru PCP.  PMHx: Past Medical History:  Diagnosis Date  . Menorrhagia    Past Surgical History:  Procedure Laterality Date  . CESAREAN SECTION    . DILATION AND CURETTAGE, DIAGNOSTIC / THERAPEUTIC    . HYSTEROSCOPY    . LAPAROSCOPIC SUPRACERVICAL HYSTERECTOMY     Family History  Problem Relation Age of Onset  . Diabetes Mother   . Osteoporosis Mother   . Skin cancer Mother   . Lung cancer Father   . Breast cancer Neg Hx    Social History   Tobacco Use  . Smoking status: Never Smoker  . Smokeless tobacco: Never Used  Substance Use Topics  . Alcohol use: No    Frequency: Never  . Drug use: No    Current Outpatient Medications:  .  spironolactone (ALDACTONE) 50 MG tablet, Take by mouth., Disp: , Rfl:  Allergies: Aspirin; Penicillin v potassium; Salicylates; and Itraconazole  Review of Systems  Constitutional: Negative for chills, fever and malaise/fatigue.  HENT: Negative for congestion, sinus pain and sore throat.   Eyes: Negative for blurred vision and pain.  Respiratory: Negative for cough and wheezing.   Cardiovascular: Negative for chest pain and leg swelling.  Gastrointestinal: Negative for abdominal pain, constipation, diarrhea, heartburn,  nausea and vomiting.  Genitourinary: Negative for dysuria, frequency, hematuria and urgency.  Musculoskeletal: Negative for back pain, joint pain, myalgias and neck pain.  Skin: Negative for itching and rash.  Neurological: Negative for dizziness, tremors and weakness.  Endo/Heme/Allergies: Does not bruise/bleed easily.  Psychiatric/Behavioral: Negative for depression. The patient is not nervous/anxious and does not have insomnia.     Objective: BP 100/60   Ht 5\' 10"  (1.778 m)   Wt 156 lb (70.8 kg)   BMI 22.38 kg/m   Filed Weights   11/05/17 1405  Weight: 156 lb (70.8 kg)   Body mass index is 22.38 kg/m. Physical Exam  Constitutional: She is oriented to person, place, and time. She appears well-developed and well-nourished. No distress.  Genitourinary: Rectum normal and vagina normal. Pelvic exam was performed with patient supine. There is no rash or lesion on the right labia. There is no rash or lesion on the left labia. Vagina exhibits no lesion. No bleeding in the vagina. Right adnexum does not display mass and does not display tenderness. Left adnexum does not display mass and does not display tenderness. Cervix does not exhibit motion tenderness, lesion, friability or polyp.  HENT:  Head: Normocephalic and atraumatic. Head is without laceration.  Right Ear: Hearing normal.  Left Ear: Hearing normal.  Nose: No epistaxis.  No foreign bodies.  Mouth/Throat: Uvula is midline, oropharynx is clear and moist and mucous membranes are normal.  Eyes: Pupils are equal, round, and reactive to  light.  Neck: Normal range of motion. Neck supple. No thyromegaly present.  Cardiovascular: Normal rate and regular rhythm. Exam reveals no gallop and no friction rub.  No murmur heard. Pulmonary/Chest: Effort normal and breath sounds normal. No respiratory distress. She has no wheezes. Right breast exhibits no mass, no skin change and no tenderness. Left breast exhibits no mass, no skin change and no  tenderness.  Abdominal: Soft. Bowel sounds are normal. She exhibits no distension. There is no tenderness. There is no rebound.  Musculoskeletal: Normal range of motion.  Neurological: She is alert and oriented to person, place, and time. No cranial nerve deficit.  Skin: Skin is warm and Vasbinder.  Psychiatric: She has a normal mood and affect. Judgment normal.  Vitals reviewed.  Assessment: Annual Exam 1. Annual physical exam   2. Screen for colon cancer    Plan:            1.  Cervical Screening-  Pap smear done today, desires yearly (protocol discussed for every three years)  2. Breast screening- Exam annually and mammogram scheduled  3. Colonoscopy every 10 years, [facility] 52 [facility]  4. Labs managed by PCP  5. Counseling for hormonal therapy: none, no change in therapy today    F/U  Return in about 1 year (around 11/05/2018) for Annual.  Annamarie MajorPaul Taleya Whitcher, MD, Merlinda FrederickFACOG [facility], [facility] 11/05/2017  2:44 PM

## 2017-11-05 NOTE — Patient Instructions (Signed)
PAP every one to three years Mammogram every year    Call [phone] [facility] Colonoscopy every 10 years Labs yearly (with PCP)

## 2017-11-05 NOTE — Addendum Note (Signed)
Addended by: Nadara MustardHARRIS, Reannon Candella P on: 11/05/2017 04:24 PM   Modules accepted: Orders

## 2017-11-07 ENCOUNTER — Telehealth: Payer: Self-pay

## 2017-11-07 ENCOUNTER — Other Ambulatory Visit: Payer: Self-pay

## 2017-11-07 DIAGNOSIS — Z1211 Encounter for screening for malignant neoplasm of colon: Secondary | ICD-10-CM

## 2017-11-07 LAB — PAP IG (IMAGE GUIDED): PAP Smear Comment: 0

## 2017-11-07 NOTE — Telephone Encounter (Signed)
Gastroenterology Pre-Procedure Review  Request Date: 12/10/17 Requesting Physician: Dr. Servando SnareWohl  PATIENT REVIEW QUESTIONS: The patient responded to the following health history questions as indicated:    1. Are you having any GI issues? no 2. Do you have a personal history of Polyps? no 3. Do you have a family history of Colon Cancer or Polyps? no 4. Diabetes Mellitus? no 5. Joint replacements in the past 12 months?no 6. Major health problems in the past 3 months?no 7. Any artificial heart valves, MVP, or defibrillator?no    MEDICATIONS & ALLERGIES:    Patient reports the following regarding taking any anticoagulation/antiplatelet therapy:   Plavix, Coumadin, Eliquis, Xarelto, Lovenox, Pradaxa, Brilinta, or Effient? no Aspirin? no  Patient confirms/reports the following medications:  Current Outpatient Medications  Medication Sig Dispense Refill  . spironolactone (ALDACTONE) 50 MG tablet Take by mouth.     No current facility-administered medications for this visit.     Patient confirms/reports the following allergies:  Allergies  Allergen Reactions  . Aspirin     Other reaction(s): Other (See Comments) Childhood allergy  . Penicillin V Potassium     Other reaction(s): Unknown  . Salicylates   . Itraconazole Rash    No orders of the defined types were placed in this encounter.   AUTHORIZATION INFORMATION Primary Insurance: 1D#: Group #:  Secondary Insurance: 1D#: Group #:  SCHEDULE INFORMATION: Date: 12/10/17 Time: Location:[facility]

## 2017-12-02 ENCOUNTER — Telehealth: Payer: Self-pay | Admitting: Gastroenterology

## 2017-12-02 NOTE — Telephone Encounter (Signed)
Patient left a voice message wanting to know what the hospital uses to sterilize instruments and should she cut back on her activities. She works out every day. Please call

## 2017-12-03 NOTE — Telephone Encounter (Signed)
Advised pt to contact the Endo unit to ask about sterilization. As far as her exercise activity, I have advised have the day of her procedure depending on polyps removed she will abstain from activity until the next day.

## 2017-12-09 ENCOUNTER — Encounter: Payer: Self-pay | Admitting: Student

## 2017-12-10 ENCOUNTER — Encounter: Payer: Self-pay | Admitting: *Deleted

## 2017-12-10 ENCOUNTER — Ambulatory Visit
Admission: RE | Admit: 2017-12-10 | Discharge: 2017-12-10 | Disposition: A | Discharge status: Home / Self Care | Payer: Commercial Managed Care - HMO | Source: Ambulatory Visit | Attending: Gastroenterology | Admitting: Gastroenterology

## 2017-12-10 ENCOUNTER — Other Ambulatory Visit: Payer: Self-pay

## 2017-12-10 ENCOUNTER — Ambulatory Visit: Payer: Commercial Managed Care - HMO | Admitting: Anesthesiology

## 2017-12-10 ENCOUNTER — Encounter
Admission: RE | Disposition: A | Discharge status: Home / Self Care | Payer: Self-pay | Source: Ambulatory Visit | Attending: Gastroenterology

## 2017-12-10 DIAGNOSIS — Z886 Allergy status to analgesic agent status: Secondary | ICD-10-CM | POA: Diagnosis not present

## 2017-12-10 DIAGNOSIS — Z808 Family history of malignant neoplasm of other organs or systems: Secondary | ICD-10-CM | POA: Insufficient documentation

## 2017-12-10 DIAGNOSIS — Z880 Allergy status to penicillin: Secondary | ICD-10-CM | POA: Diagnosis not present

## 2017-12-10 DIAGNOSIS — Z801 Family history of malignant neoplasm of trachea, bronchus and lung: Secondary | ICD-10-CM | POA: Insufficient documentation

## 2017-12-10 DIAGNOSIS — Z1211 Encounter for screening for malignant neoplasm of colon: Secondary | ICD-10-CM | POA: Diagnosis not present

## 2017-12-10 DIAGNOSIS — Z888 Allergy status to other drugs, medicaments and biological substances status: Secondary | ICD-10-CM | POA: Insufficient documentation

## 2017-12-10 DIAGNOSIS — Z833 Family history of diabetes mellitus: Secondary | ICD-10-CM | POA: Insufficient documentation

## 2017-12-10 HISTORY — PX: COLONOSCOPY WITH PROPOFOL: SHX5780

## 2017-12-10 SURGERY — COLONOSCOPY WITH PROPOFOL
Anesthesia: General

## 2017-12-10 MED ORDER — PROPOFOL 500 MG/50ML IV EMUL
INTRAVENOUS | Status: DC | PRN
  Administered 2017-12-10: 100 ug/kg/min via INTRAVENOUS

## 2017-12-10 MED ORDER — PROPOFOL 500 MG/50ML IV EMUL
INTRAVENOUS | Status: AC
  Filled 2017-12-10: qty 50

## 2017-12-10 MED ORDER — SODIUM CHLORIDE 0.9 % IV SOLN
INTRAVENOUS | Status: DC
  Administered 2017-12-10: 08:00:00 via INTRAVENOUS

## 2017-12-10 MED ORDER — PROPOFOL 10 MG/ML IV BOLUS
INTRAVENOUS | Status: AC
  Filled 2017-12-10: qty 20

## 2017-12-10 MED ORDER — PROPOFOL 10 MG/ML IV BOLUS
INTRAVENOUS | Status: DC | PRN
  Administered 2017-12-10: 100 mg via INTRAVENOUS

## 2017-12-10 MED ORDER — LIDOCAINE HCL (CARDIAC) 20 MG/ML IV SOLN
INTRAVENOUS | Status: DC | PRN
  Administered 2017-12-10: 100 mg via INTRAVENOUS

## 2017-12-10 NOTE — Anesthesia Postprocedure Evaluation (Signed)
Anesthesia Post Note  Patient: Rosalie H Sermeno  Procedure(s) Performed: COLONOSCOPY WITH PROPOFOL (N/A )  Patient location during evaluation: Endoscopy Anesthesia Type: General Level of consciousness: awake and alert Pain management: pain level controlled Vital Signs Assessment: post-procedure vital signs reviewed and stable Respiratory status: spontaneous breathing, nonlabored ventilation, respiratory function stable and patient connected to nasal cannula oxygen Cardiovascular status: blood pressure returned to baseline and stable Postop Assessment: no apparent nausea or vomiting Anesthetic complications: no     Last Vitals:  Vitals:   12/10/17 0820 12/10/17 0849  BP: 97/61   Pulse: 66 60  Resp: 16 16  Temp: (!) 36.1 C   SpO2: 100% 100%    Last Pain:  Vitals:   12/10/17 0820  TempSrc: Tympanic                 Cleda MccreedyJoseph K Markeith Jue

## 2017-12-10 NOTE — Anesthesia Preprocedure Evaluation (Signed)
Anesthesia Evaluation  Patient identified by MRN, date of birth, ID band Patient awake    Reviewed: Allergy & Precautions, H&P , NPO status , Patient's Chart, lab work & pertinent test results  Airway Mallampati: I  TM Distance: >3 FB Neck ROM: full    Dental  (+) Chipped, Poor Dentition   Pulmonary neg pulmonary ROS,           Cardiovascular Exercise Tolerance: Good (-) angina(-) Past MI and (-) PND negative cardio ROS       Neuro/Psych negative neurological ROS  negative psych ROS   GI/Hepatic negative GI ROS, Neg liver ROS, neg GERD  ,  Endo/Other  negative endocrine ROS  Renal/GU negative Renal ROS  negative genitourinary   Musculoskeletal   Abdominal   Peds  Hematology negative hematology ROS (+)   Anesthesia Other Findings Past Medical History: No date: Menorrhagia  Past Surgical History: No date: CESAREAN SECTION No date: DILATION AND CURETTAGE, DIAGNOSTIC / THERAPEUTIC No date: HYSTEROSCOPY No date: LAPAROSCOPIC SUPRACERVICAL HYSTERECTOMY  BMI    Body Mass Index:  22.38 kg/m      Reproductive/Obstetrics negative OB ROS                             Anesthesia Physical Anesthesia Plan  ASA: I  Anesthesia Plan: General   Post-op Pain Management:    Induction: Intravenous  PONV Risk Score and Plan: Propofol infusion and TIVA  Airway Management Planned: Natural Airway and Nasal Cannula  Additional Equipment:   Intra-op Plan:   Post-operative Plan:   Informed Consent: I have reviewed the patients History and Physical, chart, labs and discussed the procedure including the risks, benefits and alternatives for the proposed anesthesia with the patient or authorized representative who has indicated his/her understanding and acceptance.   Dental Advisory Given  Plan Discussed with: Anesthesiologist, CRNA and Surgeon  Anesthesia Plan Comments: (Patient consented for  risks of anesthesia including but not limited to:  - adverse reactions to medications - risk of intubation if required - damage to teeth, lips or other oral mucosa - sore throat or hoarseness - Damage to heart, brain, lungs or loss of life  Patient voiced understanding.)        Anesthesia Quick Evaluation

## 2017-12-10 NOTE — Op Note (Signed)
[facility] Gastroenterology Patient Name: Karen Ryan Procedure Date: 12/10/2017 7:47 AM MRN: [id number] Account #: [id number] Date of Birth: 1965-12-27 Admit Type: Outpatient Age: 52 Room: [facility] ENDO ROOM 4 Gender: Female Note Status: Finalized Procedure:            Colonoscopy Indications:          Screening for colorectal malignant neoplasm Providers:            Midge Minium MD, MD Referring MD:         Leotis Shames (Referring MD) Medicines:            Propofol per Anesthesia Complications:        No immediate complications. Procedure:            Pre-Anesthesia Assessment:                       - Prior to the procedure, a History and Physical was                        performed, and patient medications and allergies were                        reviewed. The patient's tolerance of previous                        anesthesia was also reviewed. The risks and benefits of                        the procedure and the sedation options and risks were                        discussed with the patient. All questions were                        answered, and informed consent was obtained. Prior                        Anticoagulants: The patient has taken no previous                        anticoagulant or antiplatelet agents. ASA Grade                        Assessment: II - A patient with mild systemic disease.                        After reviewing the risks and benefits, the patient was                        deemed in satisfactory condition to undergo the                        procedure.                       After obtaining informed consent, the colonoscope was                        passed under direct vision. Throughout the procedure,  the patient's blood pressure, pulse, and oxygen                        saturations were monitored continuously. The                        Colonoscope was introduced through the anus and   advanced to the the cecum, identified by appendiceal                        orifice and ileocecal valve. The colonoscopy was                        performed without difficulty. The patient tolerated the                        procedure well. The quality of the bowel preparation                        was excellent. Findings:      The perianal and digital rectal examinations were normal.      The colon (entire examined portion) appeared normal. Impression:           - The entire examined colon is normal.                       - No specimens collected. Recommendation:       - Discharge patient to home.                       - Resume previous diet.                       - Continue present medications.                       - Repeat colonoscopy in 10 years for screening unless                        any change in family history or lower GI problems. Procedure Code(s):    --- Professional ---                       [id number], Colonoscopy, flexible; diagnostic, including                        collection of specimen(s) by brushing or washing, when                        performed (separate procedure) Diagnosis Code(s):    --- Professional ---                       Z12.11, Encounter for screening for malignant neoplasm                        of colon CPT copyright 2016 [employer]. All rights reserved. The codes documented in this report are preliminary and upon coder review may  be revised to meet current compliance requirements. Midge Miniumarren Zyion Doxtater MD, MD 12/10/2017 8:12:59 AM This report has been signed electronically. Number of Addenda: 0 Note Initiated On: 12/10/2017 7:47 AM Scope Withdrawal Time: 0 hours  6 minutes 6 seconds  Total Procedure Duration: 0 hours 14 minutes 24 seconds       [facility]

## 2017-12-10 NOTE — Transfer of Care (Signed)
Immediate Anesthesia Transfer of Care Note  Patient: Karen Ryan  Procedure(s) Performed: COLONOSCOPY WITH PROPOFOL (N/A )  Patient Location: PACU  Anesthesia Type:General  Level of Consciousness: awake, alert  and oriented  Airway & Oxygen Therapy: Patient Spontanous Breathing  Post-op Assessment: Report given to RN  Post vital signs: Reviewed and stable  Last Vitals:  Vitals:   12/10/17 0716  BP: 108/69  Pulse: 66  Resp: 18  Temp: (!) 35.5 C  SpO2: 99%    Last Pain:  Vitals:   12/10/17 0716  TempSrc: Tympanic      Patients Stated Pain Goal: 8 (12/10/17 0716)  Complications: No apparent anesthesia complications

## 2017-12-10 NOTE — H&P (Signed)
   Karen Miniumarren Kilea Mccarey, Karen Ryan [facility] [address] [place], [place] [address] Phone: [phone] Fax : [phone]  Primary Care Physician:  Karen Ryan, Jasmine, Karen Ryan Primary Gastroenterologist:  Dr. Servando SnareWohl  Pre-Procedure History & Physical: HPI:  Karen Ryan is a 52 y.o. female is here for a screening colonoscopy.   Past Medical History:  Diagnosis Date  . Menorrhagia     Past Surgical History:  Procedure Laterality Date  . CESAREAN SECTION    . DILATION AND CURETTAGE, DIAGNOSTIC / THERAPEUTIC    . HYSTEROSCOPY    . LAPAROSCOPIC SUPRACERVICAL HYSTERECTOMY      Prior to Admission medications   Medication Sig Start Date End Date Taking? Authorizing Provider  spironolactone (ALDACTONE) 50 MG tablet Take by mouth. 05/05/14  Yes Provider, Historical, Karen Ryan    Allergies as of 11/07/2017 - Review Complete 11/05/2017  Allergen Reaction Noted  . Aspirin  09/26/2017  . Penicillin v potassium  05/21/2014  . Salicylates  11/05/2017  . Itraconazole Rash 05/21/2014    Family History  Problem Relation Age of Onset  . Diabetes Mother   . Osteoporosis Mother   . Skin cancer Mother   . Lung cancer Father   . Breast cancer Neg Hx     Social History   Socioeconomic History  . Marital status: Married    Spouse name: Not on file  . Number of children: Not on file  . Years of education: Not on file  . Highest education level: Not on file  Social Needs  . Financial resource strain: Not on file  . Food insecurity - worry: Not on file  . Food insecurity - inability: Not on file  . Transportation needs - medical: Not on file  . Transportation needs - non-medical: Not on file  Occupational History  . Not on file  Tobacco Use  . Smoking status: Never Smoker  . Smokeless tobacco: Never Used  Substance and Sexual Activity  . Alcohol use: No    Frequency: Never  . Drug use: No  . Sexual activity: Yes  Other Topics Concern  . Not on file  Social History Narrative  . Not on file     Review of Systems: See HPI, otherwise negative ROS  Physical Exam: BP 97/61   Pulse 60   Temp (!) 97 F (36.1 C) (Tympanic)   Resp 16   Ht 5\' 10"  (1.778 m)   Wt 156 lb (70.8 kg)   SpO2 100%   BMI 22.38 kg/m  General:   Alert,  pleasant and cooperative in NAD Head:  Normocephalic and atraumatic. Neck:  Supple; no masses or thyromegaly. Lungs:  Clear throughout to auscultation.    Heart:  Regular rate and rhythm. Abdomen:  Soft, nontender and nondistended. Normal bowel sounds, without guarding, and without rebound.   Neurologic:  Alert and  oriented x4;  grossly normal neurologically.  Impression/Plan: Karen Ryan is now here to undergo a screening colonoscopy.  Risks, benefits, and alternatives regarding colonoscopy have been reviewed with the patient.  Questions have been answered.  All parties agreeable.

## 2017-12-10 NOTE — Anesthesia Post-op Follow-up Note (Signed)
Anesthesia QCDR form completed.        

## 2017-12-11 ENCOUNTER — Encounter: Payer: Self-pay | Admitting: Gastroenterology

## 2018-09-03 ENCOUNTER — Other Ambulatory Visit: Payer: Self-pay | Admitting: Obstetrics & Gynecology

## 2018-09-03 DIAGNOSIS — Z1231 Encounter for screening mammogram for malignant neoplasm of breast: Secondary | ICD-10-CM

## 2018-09-24 ENCOUNTER — Other Ambulatory Visit
Admission: RE | Admit: 2018-09-24 | Discharge: 2018-09-24 | Disposition: A | Discharge status: Home / Self Care | Payer: 59 | Source: Ambulatory Visit | Attending: Ophthalmology | Admitting: Ophthalmology

## 2018-09-24 DIAGNOSIS — H3581 Retinal edema: Secondary | ICD-10-CM | POA: Insufficient documentation

## 2018-09-24 DIAGNOSIS — I10 Essential (primary) hypertension: Secondary | ICD-10-CM | POA: Insufficient documentation

## 2018-09-24 DIAGNOSIS — H35032 Hypertensive retinopathy, left eye: Secondary | ICD-10-CM | POA: Insufficient documentation

## 2018-09-24 LAB — CBC
HCT: 42.8 % (ref 36.0–46.0)
Hemoglobin: 14.3 g/dL (ref 12.0–15.0)
MCH: 29.6 pg (ref 26.0–34.0)
MCHC: 33.4 g/dL (ref 30.0–36.0)
MCV: 88.6 fL (ref 80.0–100.0)
PLATELETS: 159 10*3/uL (ref 150–400)
RBC: 4.83 MIL/uL (ref 3.87–5.11)
RDW: 12.5 % (ref 11.5–15.5)
WBC: 4.1 10*3/uL (ref 4.0–10.5)
nRBC: 0 % (ref 0.0–0.2)

## 2018-09-24 LAB — SEDIMENTATION RATE: Sed Rate: 3 mm/hr (ref 0–30)

## 2018-09-24 LAB — HEMOGLOBIN A1C
Hgb A1c MFr Bld: 4.5 % — ABNORMAL LOW (ref 4.8–5.6)
Mean Plasma Glucose: 82.45 mg/dL

## 2018-09-30 ENCOUNTER — Encounter: Payer: Self-pay | Admitting: Obstetrics & Gynecology

## 2018-09-30 ENCOUNTER — Ambulatory Visit
Admission: RE | Admit: 2018-09-30 | Discharge: 2018-09-30 | Disposition: A | Discharge status: Home / Self Care | Payer: BLUE CROSS/BLUE SHIELD | Source: Ambulatory Visit | Attending: Obstetrics & Gynecology | Admitting: Obstetrics & Gynecology

## 2018-09-30 DIAGNOSIS — Z1231 Encounter for screening mammogram for malignant neoplasm of breast: Secondary | ICD-10-CM | POA: Diagnosis not present

## 2018-09-30 IMAGING — MG DIGITAL SCREENING BILATERAL MAMMOGRAM WITH TOMO AND CAD
8 series · 9 of 24 positions shown · non-contrast
Comparison: Previous exam(s).

CLINICAL DATA: Screening.

EXAM:
DIGITAL SCREENING BILATERAL MAMMOGRAM WITH TOMO AND CAD

[R CC synth-2D]
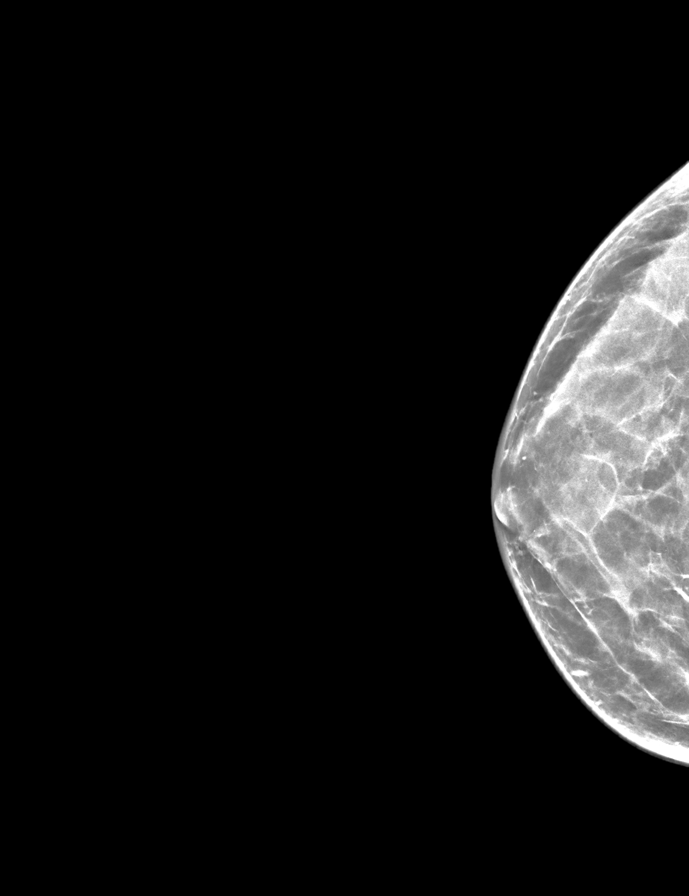

[L CC synth-2D]
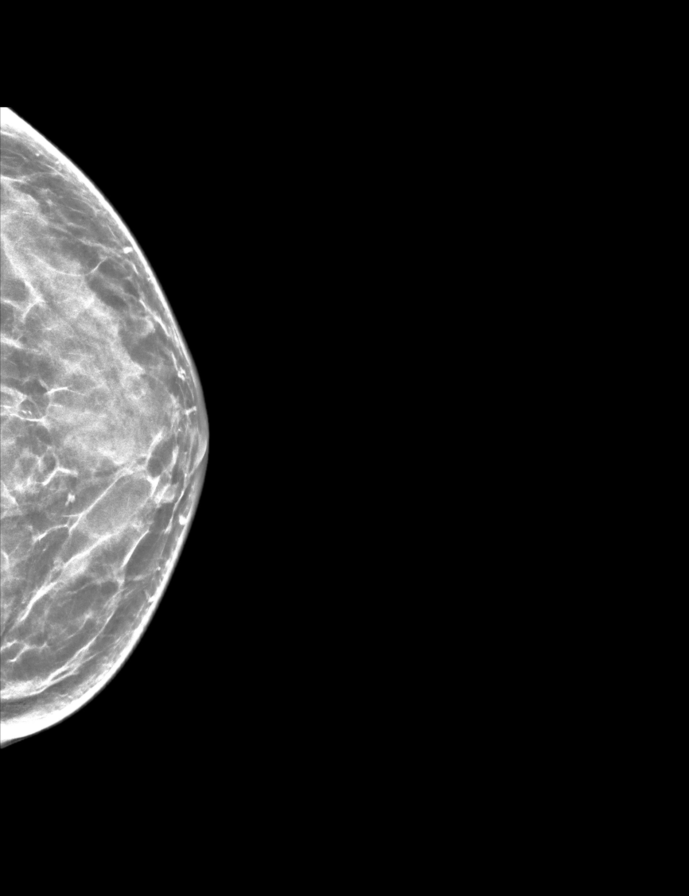

[R MLO synth-2D]
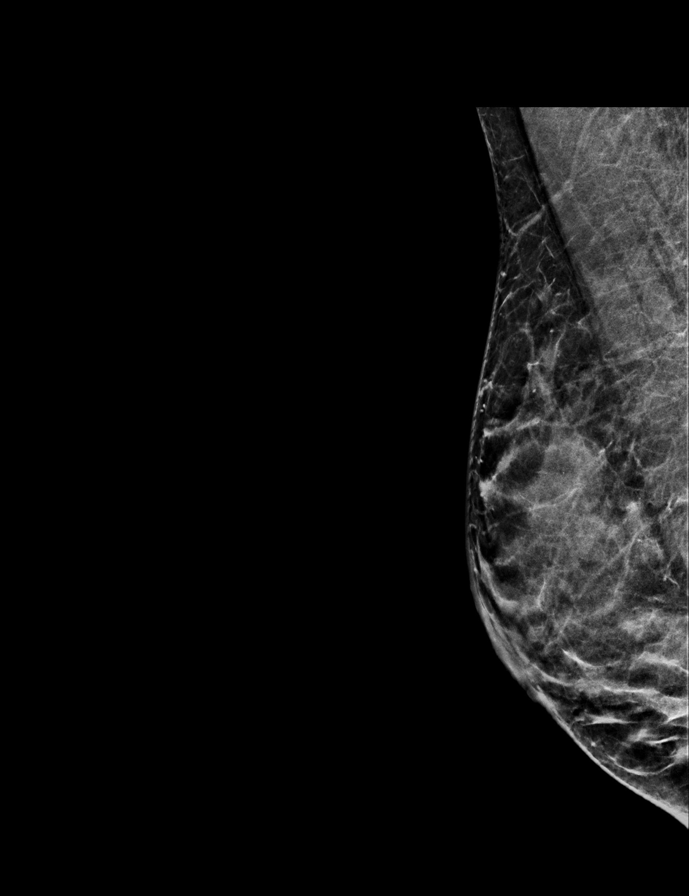

[L MLO synth-2D]
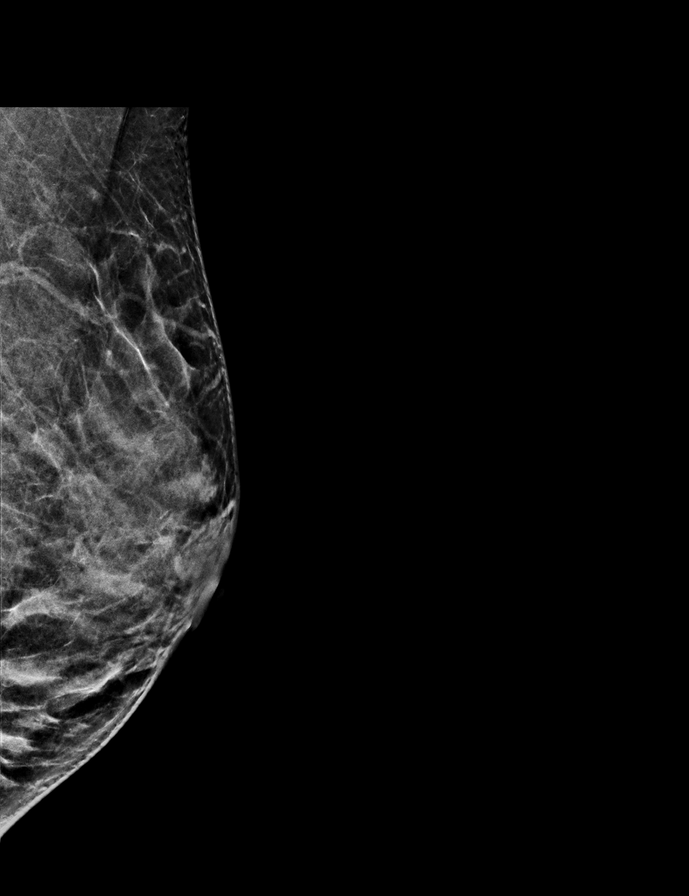

[R CC tomo · 2 of 51 frames shown]
[frame 17/51]
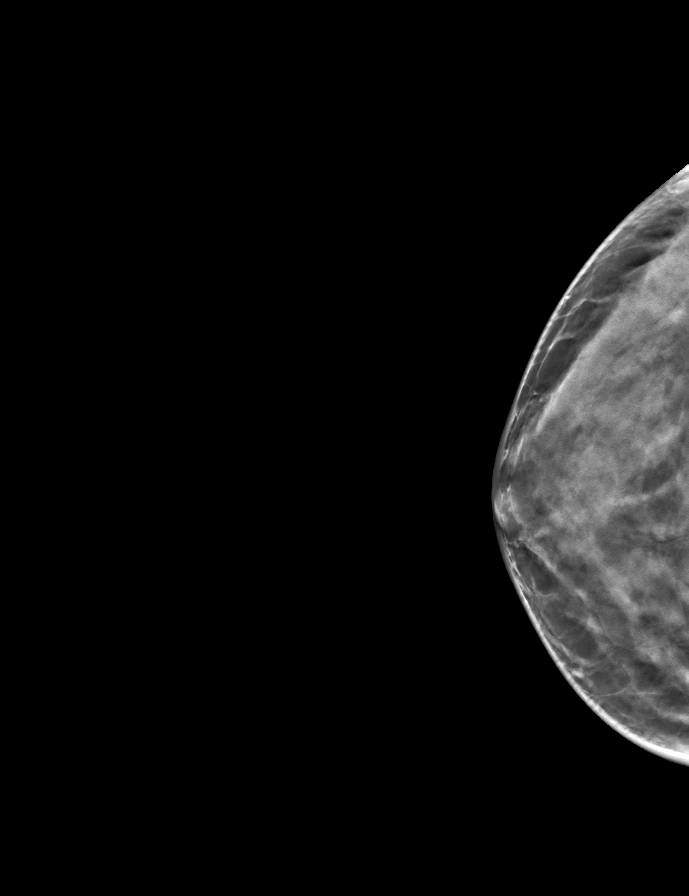
[frame 26/51]
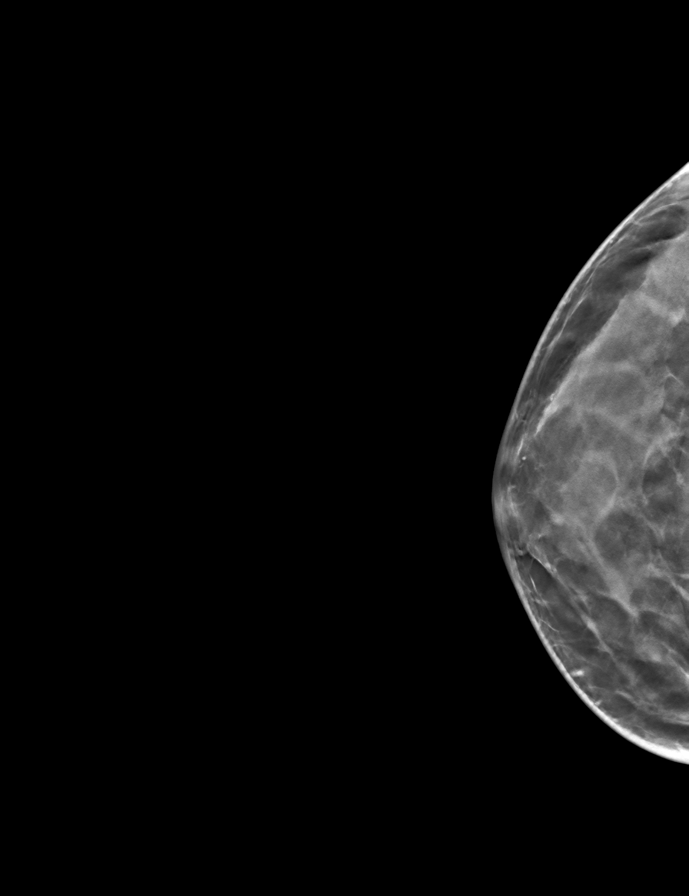

[R MLO tomo · tomo slice 25/48.0]
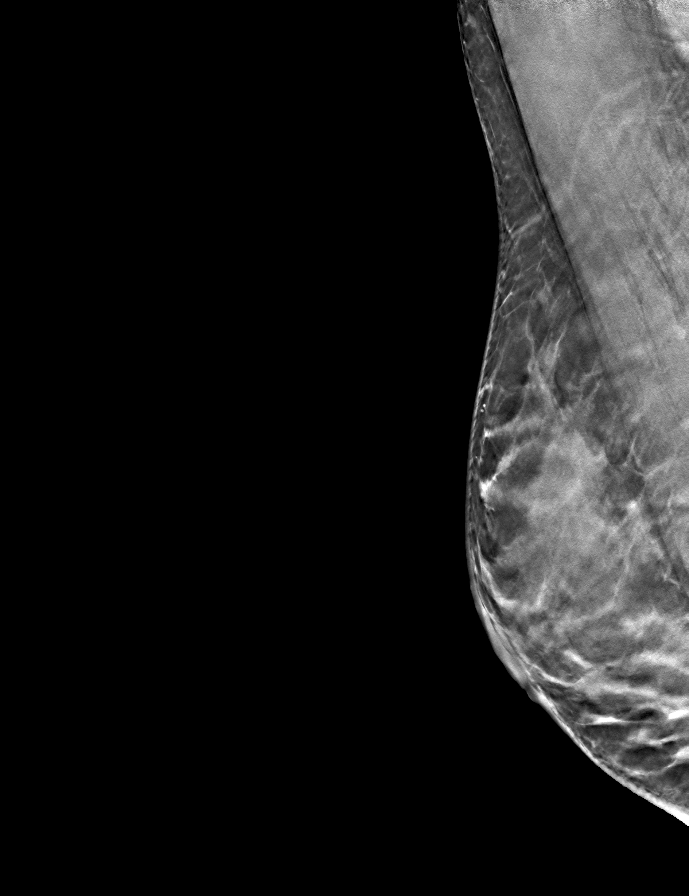

[L MLO tomo · tomo slice 23/45.0]
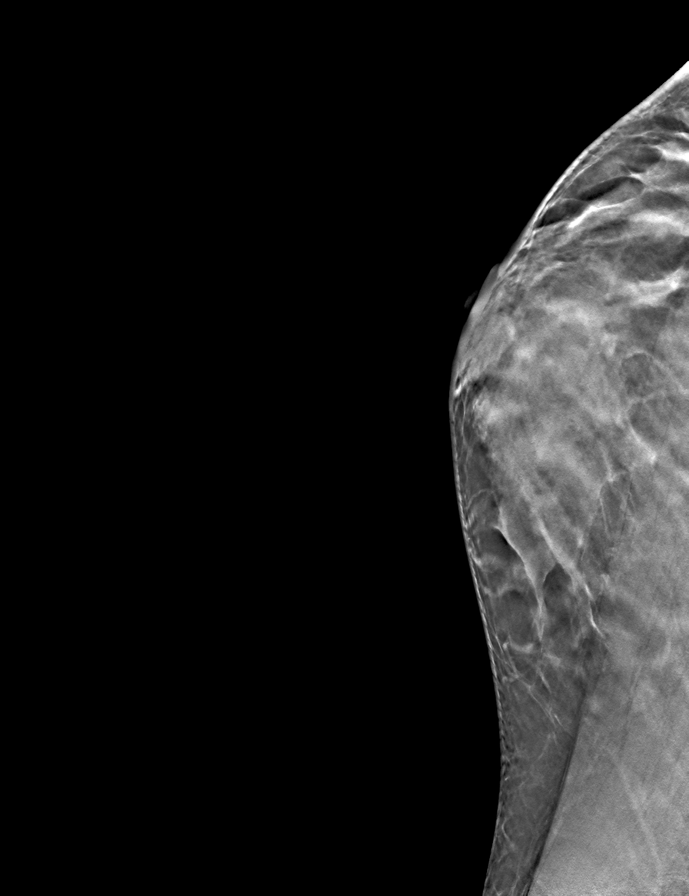

[L CC tomo · tomo slice 27/52.0]
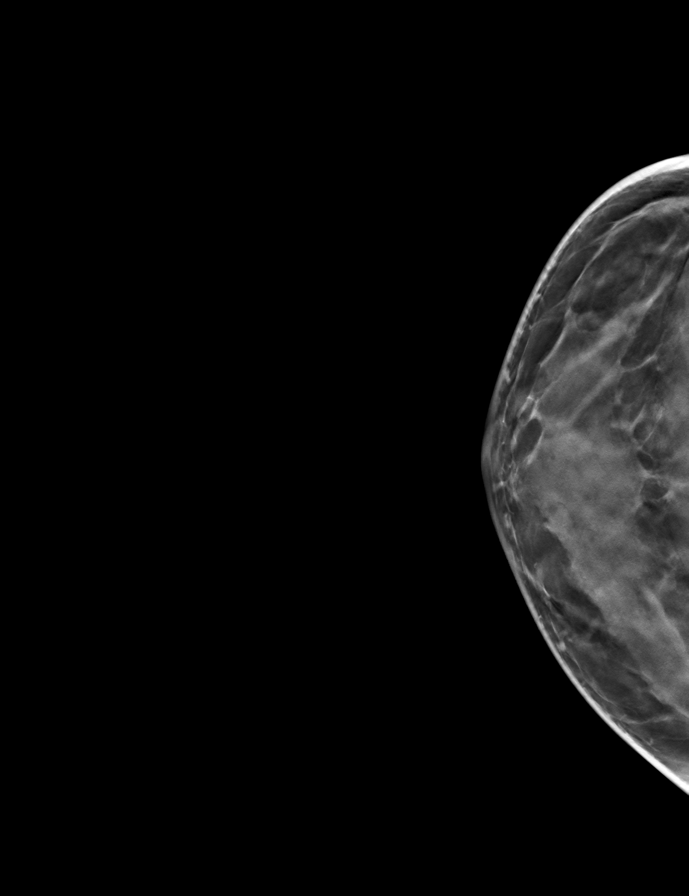

[9 of 24 positions shown; findings below may reference images not displayed]

ACR Breast Density Category d: The breast tissue is extremely dense,
which lowers the sensitivity of mammography
FINDINGS: There are no findings suspicious for malignancy. Images were
processed with CAD.
IMPRESSION: No mammographic evidence of malignancy. A result letter of this
screening mammogram will be mailed directly to the patient.

RECOMMENDATION:
Screening mammogram in one year. (Code:[5I])

BI-RADS CATEGORY  1: Negative.

## 2018-10-03 DIAGNOSIS — M7051 Other bursitis of knee, right knee: Secondary | ICD-10-CM | POA: Diagnosis not present

## 2018-10-30 DIAGNOSIS — H3581 Retinal edema: Secondary | ICD-10-CM | POA: Diagnosis not present

## 2018-11-04 DIAGNOSIS — N289 Disorder of kidney and ureter, unspecified: Secondary | ICD-10-CM | POA: Diagnosis not present

## 2018-11-04 DIAGNOSIS — Z0000 Encounter for general adult medical examination without abnormal findings: Secondary | ICD-10-CM | POA: Diagnosis not present

## 2018-11-04 DIAGNOSIS — Z131 Encounter for screening for diabetes mellitus: Secondary | ICD-10-CM | POA: Diagnosis not present

## 2018-11-04 DIAGNOSIS — Z1389 Encounter for screening for other disorder: Secondary | ICD-10-CM | POA: Diagnosis not present

## 2018-11-07 ENCOUNTER — Ambulatory Visit (INDEPENDENT_AMBULATORY_CARE_PROVIDER_SITE_OTHER): Payer: BLUE CROSS/BLUE SHIELD | Admitting: Obstetrics & Gynecology

## 2018-11-07 ENCOUNTER — Encounter: Payer: Self-pay | Admitting: Obstetrics & Gynecology

## 2018-11-07 ENCOUNTER — Other Ambulatory Visit (HOSPITAL_COMMUNITY)
Admission: RE | Admit: 2018-11-07 | Discharge: 2018-11-07 | Disposition: A | Discharge status: Home / Self Care | Payer: BLUE CROSS/BLUE SHIELD | Source: Ambulatory Visit | Attending: Obstetrics & Gynecology | Admitting: Obstetrics & Gynecology

## 2018-11-07 VITALS — BP 100/60 | Ht 69.0 in | Wt 157.0 lb

## 2018-11-07 DIAGNOSIS — Z124 Encounter for screening for malignant neoplasm of cervix: Secondary | ICD-10-CM

## 2018-11-07 DIAGNOSIS — Z23 Encounter for immunization: Secondary | ICD-10-CM | POA: Diagnosis not present

## 2018-11-07 DIAGNOSIS — Z01419 Encounter for gynecological examination (general) (routine) without abnormal findings: Secondary | ICD-10-CM | POA: Diagnosis not present

## 2018-11-07 NOTE — Patient Instructions (Signed)
PAP every one to three years Mammogram every year    Call [phone] to schedule at [facility] Colonoscopy every 10 years Labs yearly (with PCP)

## 2018-11-07 NOTE — Progress Notes (Signed)
HPI:      Karen Ryan is a 53 y.o. G1P1001 who [facility], she presents today for her annual examination.  The patient has no complaints today. The patient is sexually active. Herlast pap: approximate date 2018 and was normal and last mammogram: approximate date 09/2018 and was normal.  The patient does perform self breast exams.  There is no notable family history of breast or ovarian cancer in her family. The patient is not taking hormone replacement therapy. Patient denies post-menopausal vaginal bleeding.   The patient has regular exercise: yes. The patient denies current symptoms of depression.    GYN Hx: Last Colonoscopy:1 year ago. Normal.  Last DEXA: never ago.    PMHx: Past Medical History:  Diagnosis Date  . Menorrhagia    Past Surgical History:  Procedure Laterality Date  . CESAREAN SECTION    . COLONOSCOPY WITH PROPOFOL N/A 12/10/2017   Procedure: COLONOSCOPY WITH PROPOFOL;  Surgeon: Midge MiniumWohl, Darren, MD;  [facility];  Service: Endoscopy;  Laterality: N/A;  . DILATION AND CURETTAGE, DIAGNOSTIC / THERAPEUTIC    . HYSTEROSCOPY    . LAPAROSCOPIC SUPRACERVICAL HYSTERECTOMY     Family History  Problem Relation Age of Onset  . Diabetes Mother   . Osteoporosis Mother   . Skin cancer Mother   . Lung cancer Father   . Breast cancer Neg Hx    Social History   Tobacco Use  . Smoking status: Never Smoker  . Smokeless tobacco: Never Used  Substance Use Topics  . Alcohol use: No    Frequency: Never  . Drug use: No    Current Outpatient Medications:  .  spironolactone (ALDACTONE) 50 MG tablet, Take by mouth., Disp: , Rfl:  Allergies: Aspirin; Penicillin v potassium; Salicylates; and Itraconazole  Review of Systems  Constitutional: Negative for chills, fever and malaise/fatigue.  HENT: Negative for congestion, sinus pain and sore throat.   Eyes: Negative for blurred vision and pain.  Respiratory: Negative for cough and wheezing.     Cardiovascular: Negative for chest pain and leg swelling.  Gastrointestinal: Negative for abdominal pain, constipation, diarrhea, heartburn, nausea and vomiting.  Genitourinary: Negative for dysuria, frequency, hematuria and urgency.  Musculoskeletal: Negative for back pain, joint pain, myalgias and neck pain.  Skin: Negative for itching and rash.  Neurological: Negative for dizziness, tremors and weakness.  Endo/Heme/Allergies: Does not bruise/bleed easily.  Psychiatric/Behavioral: Negative for depression. The patient is not nervous/anxious and does not have insomnia.     Objective: BP 100/60   Ht 5\' 9"  (1.753 m)   Wt 157 lb (71.2 kg)   BMI 23.18 kg/m   Filed Weights   11/07/18 0805  Weight: 157 lb (71.2 kg)   Body mass index is 23.18 kg/m. Physical Exam Constitutional:      General: She is not in acute distress.    Appearance: She is well-developed.  Genitourinary:     Pelvic exam was performed with patient supine.     Vagina and cervix normal.     No lesions in the vagina.     No vaginal bleeding.     No cervical polyp.     Uterus is absent.     No right or left adnexal mass present.     Right adnexa not tender.     Left adnexa not tender.  HENT:     Head: Normocephalic and atraumatic. No laceration.     Right Ear: Hearing normal.  Left Ear: Hearing normal.     Mouth/Throat:     Pharynx: Uvula midline.  Eyes:     Pupils: Pupils are equal, round, and reactive to light.  Neck:     Musculoskeletal: Normal range of motion and neck supple.     Thyroid: No thyromegaly.  Cardiovascular:     Rate and Rhythm: Normal rate and regular rhythm.     Heart sounds: No murmur. No friction rub. No gallop.   Pulmonary:     Effort: Pulmonary effort is normal. No respiratory distress.     Breath sounds: Normal breath sounds. No wheezing.  Chest:     Breasts:        Right: No mass, skin change or tenderness.        Left: No mass, skin change or tenderness.  Abdominal:      General: Bowel sounds are normal. There is no distension.     Palpations: Abdomen is soft.     Tenderness: There is no abdominal tenderness. There is no rebound.  Musculoskeletal: Normal range of motion.  Neurological:     Mental Status: She is alert and oriented to person, place, and time.     Cranial Nerves: No cranial nerve deficit.  Skin:    General: Skin is warm and E[place].  Psychiatric:        Judgment: Judgment normal.  Vitals signs reviewed.   Assessment: Annual Exam 1. Women's annual routine gynecological examination   2. Need for immunization against influenza   3. Screening for cervical cancer    Plan:            1.  Cervical Screening-  Pap smear done today  2. Breast screening- Exam annually and mammogram scheduled  3. Colonoscopy every 10 years, Hemoccult testing after age 37  4. Labs managed by PCP  5. Counseling for hormonal therapy: none     F/U  Return in about 1 year (around 11/08/2019) for Annual.  Karen Major, MD, [facility], [facility] 11/07/2018  8:25 AM

## 2018-11-11 DIAGNOSIS — Z0000 Encounter for general adult medical examination without abnormal findings: Secondary | ICD-10-CM | POA: Diagnosis not present

## 2018-11-11 DIAGNOSIS — Z23 Encounter for immunization: Secondary | ICD-10-CM | POA: Diagnosis not present

## 2018-11-11 LAB — CYTOLOGY - PAP: DIAGNOSIS: NEGATIVE

## 2018-11-26 DIAGNOSIS — M9903 Segmental and somatic dysfunction of lumbar region: Secondary | ICD-10-CM | POA: Diagnosis not present

## 2018-11-26 DIAGNOSIS — M9904 Segmental and somatic dysfunction of sacral region: Secondary | ICD-10-CM | POA: Diagnosis not present

## 2018-11-26 DIAGNOSIS — M9905 Segmental and somatic dysfunction of pelvic region: Secondary | ICD-10-CM | POA: Diagnosis not present

## 2018-11-26 DIAGNOSIS — M545 Low back pain: Secondary | ICD-10-CM | POA: Diagnosis not present

## 2018-12-29 DIAGNOSIS — B351 Tinea unguium: Secondary | ICD-10-CM | POA: Diagnosis not present

## 2018-12-29 DIAGNOSIS — L601 Onycholysis: Secondary | ICD-10-CM | POA: Diagnosis not present

## 2019-01-14 DIAGNOSIS — L603 Nail dystrophy: Secondary | ICD-10-CM | POA: Diagnosis not present

## 2019-03-24 DIAGNOSIS — M9903 Segmental and somatic dysfunction of lumbar region: Secondary | ICD-10-CM | POA: Diagnosis not present

## 2019-03-24 DIAGNOSIS — M9905 Segmental and somatic dysfunction of pelvic region: Secondary | ICD-10-CM | POA: Diagnosis not present

## 2019-03-24 DIAGNOSIS — M9904 Segmental and somatic dysfunction of sacral region: Secondary | ICD-10-CM | POA: Diagnosis not present

## 2019-03-24 DIAGNOSIS — M545 Low back pain: Secondary | ICD-10-CM | POA: Diagnosis not present

## 2019-03-25 DIAGNOSIS — D2271 Melanocytic nevi of right lower limb, including hip: Secondary | ICD-10-CM | POA: Diagnosis not present

## 2019-03-25 DIAGNOSIS — D2262 Melanocytic nevi of left upper limb, including shoulder: Secondary | ICD-10-CM | POA: Diagnosis not present

## 2019-03-25 DIAGNOSIS — D225 Melanocytic nevi of trunk: Secondary | ICD-10-CM | POA: Diagnosis not present

## 2019-03-25 DIAGNOSIS — D2261 Melanocytic nevi of right upper limb, including shoulder: Secondary | ICD-10-CM | POA: Diagnosis not present

## 2019-06-04 DIAGNOSIS — Z2089 Contact with and (suspected) exposure to other communicable diseases: Secondary | ICD-10-CM | POA: Diagnosis not present

## 2019-06-04 DIAGNOSIS — B409 Blastomycosis, unspecified: Secondary | ICD-10-CM | POA: Diagnosis not present

## 2019-08-17 ENCOUNTER — Other Ambulatory Visit: Payer: Self-pay

## 2019-08-17 ENCOUNTER — Other Ambulatory Visit (HOSPITAL_COMMUNITY)
Admission: RE | Admit: 2019-08-17 | Discharge: 2019-08-17 | Disposition: A | Discharge status: Home / Self Care | Payer: BC Managed Care – PPO | Source: Ambulatory Visit | Attending: Obstetrics & Gynecology | Admitting: Obstetrics & Gynecology

## 2019-08-17 ENCOUNTER — Ambulatory Visit (INDEPENDENT_AMBULATORY_CARE_PROVIDER_SITE_OTHER): Payer: BC Managed Care – PPO | Admitting: Obstetrics & Gynecology

## 2019-08-17 ENCOUNTER — Encounter: Payer: Self-pay | Admitting: Obstetrics & Gynecology

## 2019-08-17 VITALS — BP 120/80 | Ht 69.0 in | Wt 164.0 lb

## 2019-08-17 DIAGNOSIS — L292 Pruritus vulvae: Secondary | ICD-10-CM

## 2019-08-17 DIAGNOSIS — N904 Leukoplakia of vulva: Secondary | ICD-10-CM | POA: Diagnosis not present

## 2019-08-17 NOTE — Patient Instructions (Signed)
VULVAR BIOPSY POST-PROCEDURE INSTRUCTIONS  1. You may take Ibuprofen, Aleve or Tylenol for pain if needed.    2. You may have a small amount of spotting.  You should wear a mini pad for the next few days.  3. You may use some topical Neosporin ointment if you would like (over the counter is fine).  4. You need to call if you have redness around the biopsy site, if there is any unusual draining, if the bleeding is heavy, or if you are concerned.  5. Shower or bathe as normal  6. We will call you within one week with results or we will discuss the results at your follow-up appointment if needed.   Lichen Sclerosus Lichen sclerosus is a skin problem. It can happen on any part of the body, but it commonly involves the anal or genital areas. It can cause itching and discomfort in these areas. Treatment can help to control symptoms. When the genital area is affected, getting treatment is important because the condition can cause scarring that may lead to other problems. What are the causes? The cause of this condition is not known. It may be related to an overactive immune system or a lack of certain hormones. Lichen sclerosus is not an infection or a fungus, and it is not passed from one person to another (not contagious). What increases the risk? This condition is more likely to develop in women, usually after menopause. What are the signs or symptoms? Symptoms of this condition include:  Thin, wrinkled, white areas on the skin.  Thickened white areas on the skin.  Red and swollen patches (lesions) on the skin.  Tears or cracks in the skin.  Bruising.  Blood blisters.  Severe itching.  Pain, itching, or burning when urinating. Constipation is also common in people with lichen sclerosus. How is this diagnosed? This condition may be diagnosed with a physical exam. In some cases, a tissue sample (biopsy sample) may be removed to be looked at under a microscope. How is this treated?  This condition is usually treated with medicated creams or ointments (topical steroids) that are applied over the affected areas. In some cases, treatment may also include medicines that are taken by mouth. Surgery may be needed in more severe cases that are causing problems such as scarring. Follow these instructions at home:  Take or use over-the-counter and prescription medicines only as told by your health care provider.  Use creams or ointments as told by your health care provider.  Do not scratch the affected areas of skin.  If you are a woman, be sure to keep the vaginal area as clean and Scholler as possible.  Clean the affected area of skin gently with water. Avoid using rough towels or toilet paper.  Keep all follow-up visits as told by your health care provider. This is important. Contact a health care provider if:  You have increasing redness, swelling, or pain in the affected area.  You have fluid, blood, or pus coming from the affected area.  You have new lesions on your skin.  You have a fever.  You have pain during sex. Summary  Lichen sclerosus is a skin problem. When the genital area is affected, getting treatment is important because the condition can cause scarring that may lead to other problems.  This condition is usually treated with medicated creams or ointments (topical steroids) that are applied over the affected areas.  Take or use over-the-counter and prescription medicines only as told  by your health care provider.  Contact a health care provider if you have new lesions on your skin, have pain during sex, or have increasing redness, swelling, or pain in the affected area.  Keep all follow-up visits as told by your health care provider. This is important. This information is not intended to replace advice given to you by your health care provider. Make sure you discuss any questions you have with your health care provider. Document Released: 03/07/2011  Document Revised: 02/27/2018 Document Reviewed: 02/27/2018 Elsevier Patient Education  2020 [employer].

## 2019-08-17 NOTE — Progress Notes (Signed)
HPI:      [name] is a 53 y.o. G1P1001, No LMP recorded. Patient has had a hysterectomy., presents today for a problem visit.  She complains of:  Vulvar concern:   This is a 53 y.o. old Caucasian/White female who presents for the evaluation of vulvar itching. She has noticed this intermittently for 7 mos sometimes severe itching of the vulva with no vaginal sx's, discharge, odor, bleeding.  S/p hysterectomy.  Menopausal in past, no sx's now.  Relief only w oatmeal baths.  No topicals or change in hygiene products or detergents have helped.  No rash.  Nothing noted as a trigger or making it worse.  PMHx: She  has a past medical history of Menorrhagia. Also,  has a past surgical history that includes Cesarean section; Dilation and curettage, diagnostic / therapeutic; Laparoscopic supracervical hysterectomy; Hysteroscopy; and Colonoscopy with propofol (N/A, 12/10/2017)., family history includes Diabetes in her mother; Lung cancer in her father; Osteoporosis in her mother; Skin cancer in her mother.,  reports that she has never smoked. She has never used smokeless tobacco. She reports that she does not drink alcohol or use drugs.  She has a current medication list which includes the following prescription(s): spironolactone. Also, is allergic to aspirin; penicillin v potassium; salicylates; and itraconazole.  Review of Systems  Constitutional: Negative for chills, fever and malaise/fatigue.  HENT: Negative for congestion, sinus pain and sore throat.   Eyes: Negative for blurred vision and pain.  Respiratory: Negative for cough and wheezing.   Cardiovascular: Negative for chest pain and leg swelling.  Gastrointestinal: Negative for abdominal pain, constipation, diarrhea, heartburn, nausea and vomiting.  Genitourinary: Negative for dysuria, frequency, hematuria and urgency.  Musculoskeletal: Negative for back pain, joint pain, myalgias and neck pain.  Skin: Negative for itching and rash.   Neurological: Negative for dizziness, tremors and weakness.  Endo/Heme/Allergies: Does not bruise/bleed easily.  Psychiatric/Behavioral: Negative for depression. The patient is not nervous/anxious and does not have insomnia.     Objective: BP 120/80   Ht 5\' 9"  (1.753 m)   Wt 164 lb (74.4 kg)   BMI 24.22 kg/m  Physical Exam Constitutional:      General: She is not in acute distress.    Appearance: She is well-developed.  Genitourinary:     Pelvic exam was performed with patient supine.     Vagina normal.        No vaginal erythema or bleeding.     Genitourinary Comments: Skin changes slight on right vulva, pale, no mass Cuff intact/ no lesions Absent uterus and cervix  HENT:     Head: Normocephalic and atraumatic.     Nose: Nose normal.  Abdominal:     General: There is no distension.     Palpations: Abdomen is soft.     Tenderness: There is no abdominal tenderness.  Musculoskeletal: Normal range of motion.  Neurological:     Mental Status: She is alert and oriented to person, place, and time.     Cranial Nerves: No cranial nerve deficit.  Skin:    General: Skin is warm and [place].  Psychiatric:        Attention and Perception: Attention normal.        Mood and Affect: Mood normal.        Speech: Speech normal.        Behavior: Behavior normal.        Cognition and Memory: Cognition normal.  Judgment: Judgment normal.     ASSESSMENT/PLAN:    Problem List Items Addressed This Visit    Vulvar itching    -  Primary Concern for Lichen Sclerosus   Relevant Orders   Cervicovaginal ancillary only   Surgical pathology    VULVAR BIOPSY NOTE The indications for vulvar biopsy (rule out neoplasia, establish lichen sclerosus diagnosis) were reviewed.   Risks of the biopsy including pain, bleeding, infection, inadequate specimen, scarring and need for additional procedures  were discussed. The patient stated understanding and agreed to undergo procedure today.  The  patient's vulva was prepped with Betadine. 1% lidocaine was injected into area of concern. A 3 -mm punch biopsy was done, biopsy tissue was picked up with sterile forceps and sterile scissors were used to excise the lesion.  Small bleeding was noted and hemostasis was achieved using silver nitrate sticks.  The patient tolerated the procedure well. Post-procedure instructions  (pelvic rest for one week) were given to the patient. The patient is to call with heavy bleeding, fever greater than 100.4, foul smelling vaginal discharge or other concerns.   Annamarie Major, MD, [facility], [facility] 08/17/2019  10:41 AM

## 2019-08-18 LAB — CERVICOVAGINAL ANCILLARY ONLY
Bacterial Vaginitis (gardnerella): NEGATIVE
Candida Glabrata: NEGATIVE
Candida Vaginitis: NEGATIVE
Comment: NEGATIVE
Comment: NEGATIVE
Comment: NEGATIVE

## 2019-08-19 LAB — SURGICAL PATHOLOGY

## 2019-08-20 ENCOUNTER — Encounter: Payer: Self-pay | Admitting: Obstetrics & Gynecology

## 2019-08-20 ENCOUNTER — Ambulatory Visit (INDEPENDENT_AMBULATORY_CARE_PROVIDER_SITE_OTHER): Payer: BC Managed Care – PPO | Admitting: Obstetrics & Gynecology

## 2019-08-20 ENCOUNTER — Other Ambulatory Visit: Payer: Self-pay

## 2019-08-20 VITALS — BP 100/60 | Ht 69.0 in | Wt 163.0 lb

## 2019-08-20 DIAGNOSIS — L900 Lichen sclerosus et atrophicus: Secondary | ICD-10-CM | POA: Diagnosis not present

## 2019-08-20 MED ORDER — CLOBETASOL PROPIONATE 0.05 % EX CREA: 1.0000 "application " | TOPICAL_CREAM | Freq: Two times a day (BID) | CUTANEOUS | 1 refills | Status: DC

## 2019-08-20 NOTE — Patient Instructions (Signed)
Clobetasol Propionate Topical Gel What is this medicine? CLOBETASOL (kloe BAY ta sol) is a corticosteroid. It is used on the skin to treat itching, redness, and swelling caused by some skin conditions. This medicine may be used for other purposes; ask your health care provider or pharmacist if you have questions. COMMON BRAND NAME(S): Clobevate, Embeline, Temovate What should I tell my health care provider before I take this medicine? They need to know if you have any of these conditions:  any type of active infection including measles, tuberculosis, herpes, or chickenpox  circulation problems or vascular disease  large areas of burned or damaged skin  rosacea  skin wasting or thinning  an unusual or allergic reaction to clobetasol, corticosteroids, other medicines, foods, dyes, or preservatives  pregnant or trying to get pregnant  breast-feeding How should I use this medicine? This medicine is for external use only. Do not take by mouth. Follow the directions on the prescription label. Wash your hands before and after use. Apply a thin film of medicine to the affected area. Do not cover with a bandage or dressing unless your doctor or health care professional tells you to. Do not use on healthy skin or over large areas of skin. Do not get this medicine in your eyes. If you do, rinse out with plenty of cool tap water. It is important not to use more medicine than prescribed. Do not use your medicine more often than directed. To do so may increase the chance of side effects. Talk to your pediatrician regarding the use of this medicine in children. Special care may be needed. Elderly patients are more likely to have damaged skin through aging, and this may increase side effects. This medicine should only be used for brief periods and infrequently in older patients. Overdosage: If you think you have taken too much of this medicine contact a poison control center or emergency room at  once. NOTE: This medicine is only for you. Do not share this medicine with others. What if I miss a dose? If you miss a dose, use it as soon as you can. If it is almost time for your next dose, use only that dose. Do not use double or extra doses without advice. What may interact with this medicine? Interactions are not expected. Do not use cosmetics or other skin care products on the treated area. This list may not describe all possible interactions. Give your health care provider a list of all the medicines, herbs, non-prescription drugs, or dietary supplements you use. Also tell them if you smoke, drink alcohol, or use illegal drugs. Some items may interact with your medicine. What should I watch for while using this medicine? Tell your doctor or health care professional if your symptoms do not get better within 2 weeks, or if you develop skin irritation from the medicine. Tell your doctor or health care professional if you are exposed to anyone with measles or chickenpox, or if you develop sores or blisters that do not heal properly. What side effects may I notice from receiving this medicine? Side effects that you should report to your doctor or health care professional as soon as possible:  allergic reactions like skin rash, itching or hives, swelling of the face, lips, or tongue  changes in vision  lack of healing of the skin condition  painful, red, pus filled blisters on the skin or in hair follicles  thinning of the skin with easy bruising Side effects that usually do not require  medical attention (report to your doctor or health care professional if they continue or are bothersome):  burning, irritation of the skin  redness or scaling of the skin This list may not describe all possible side effects. Call your doctor for medical advice about side effects. You may report side effects to FDA at 1-800-FDA-1088. Where should I keep my medicine? Keep out of the reach of  children. Store at room temperature between 15 and 30 degrees C (59 and 86 degrees F). Keep away from heat and direct light. Do not freeze. Throw away any unused medicine after the expiration date. NOTE: This sheet is a summary. It may not cover all possible information. If you have questions about this medicine, talk to your doctor, pharmacist, or health care provider.  2020 Elsevier/Gold Standard (2008-01-28 17:20:58)

## 2019-08-20 NOTE — Progress Notes (Signed)
  History of Present Illness:  Karen Ryan is a 53 y.o. who recently underwent labwork for biopsy of pale vulva. Also had sx's of itching and dryness.  She presents for discussion of results and plan of management. Results revealed LICHEN SCLEROSUS.  She has some discomfort from biopsy site.  PMHx: She  has a past medical history of Menorrhagia. Also,  has a past surgical history that includes Cesarean section; Dilation and curettage, diagnostic / therapeutic; Laparoscopic supracervical hysterectomy; Hysteroscopy; and Colonoscopy with propofol (N/A, 12/10/2017)., family history includes Diabetes in her mother; Lung cancer in her father; Osteoporosis in her mother; Skin cancer in her mother.,  reports that she has never smoked. She has never used smokeless tobacco. She reports that she does not drink alcohol or use drugs. No outpatient medications have been marked as taking for the 08/20/19 encounter (Office Visit) with Gae Ports, MD.  . Also, is allergic to aspirin; penicillin v potassium; salicylates; and itraconazole..  Review of Systems  All other systems reviewed and are negative.   Physical Exam:  BP 100/60   Ht 5\' 9"  (1.753 m)   Wt 163 lb (73.9 kg)   BMI 24.07 kg/m  Body mass index is 24.07 kg/m. Constitutional: Well nourished, well developed female in no acute distress.  Abdomen: diffusely non tender to palpation, non distended, and no masses, hernias Neuro: Grossly intact Psych:  Normal mood and affect.   Vulva: healing biopsy site w small eschar, no erythema  Assessment:  Problem List Items Addressed This Visit      Musculoskeletal and Integument   Lichen sclerosus - Primary      Plan: Detailed discussion of results today. Options for management discussed. Info provided. At this time, we will plan for Karen Ryan topical twice daily treatment as needed. She was amenable to this plan and we will see her back for annual/PRN.  A total of 15 minutes were spent  face-to-face with the patient during this encounter and over half of that time dealt with counseling and coordination of care.  Barnett Applebaum, MD, [facility], [facility] 08/20/2019  3:39 PM

## 2019-09-22 ENCOUNTER — Other Ambulatory Visit: Payer: Self-pay | Admitting: Obstetrics & Gynecology

## 2019-09-22 DIAGNOSIS — Z1231 Encounter for screening mammogram for malignant neoplasm of breast: Secondary | ICD-10-CM

## 2019-10-05 ENCOUNTER — Ambulatory Visit
Admission: RE | Admit: 2019-10-05 | Discharge: 2019-10-05 | Disposition: A | Discharge status: Home / Self Care | Payer: BC Managed Care – PPO | Source: Ambulatory Visit | Attending: Obstetrics & Gynecology | Admitting: Obstetrics & Gynecology

## 2019-10-05 DIAGNOSIS — Z1231 Encounter for screening mammogram for malignant neoplasm of breast: Secondary | ICD-10-CM | POA: Diagnosis not present

## 2019-10-05 IMAGING — MG DIGITAL SCREENING BILAT W/ TOMO W/ CAD
8 series · 8 of 24 positions shown · non-contrast
Comparison: Previous exam(s).

CLINICAL DATA: Screening.

EXAM:
DIGITAL SCREENING BILATERAL MAMMOGRAM WITH TOMO AND CAD

[L CC synth-2D]
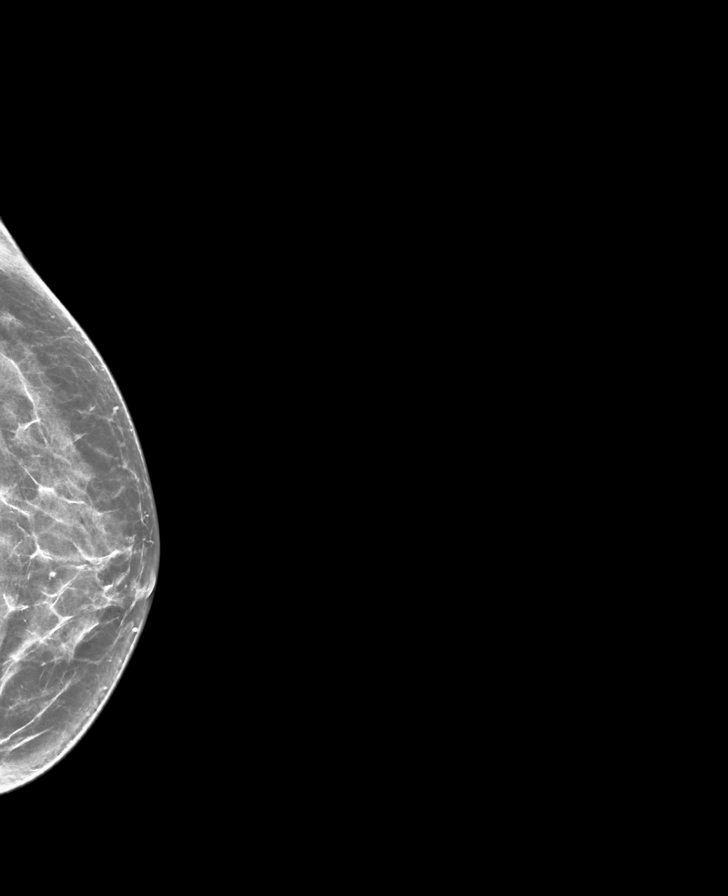

[R CC synth-2D]
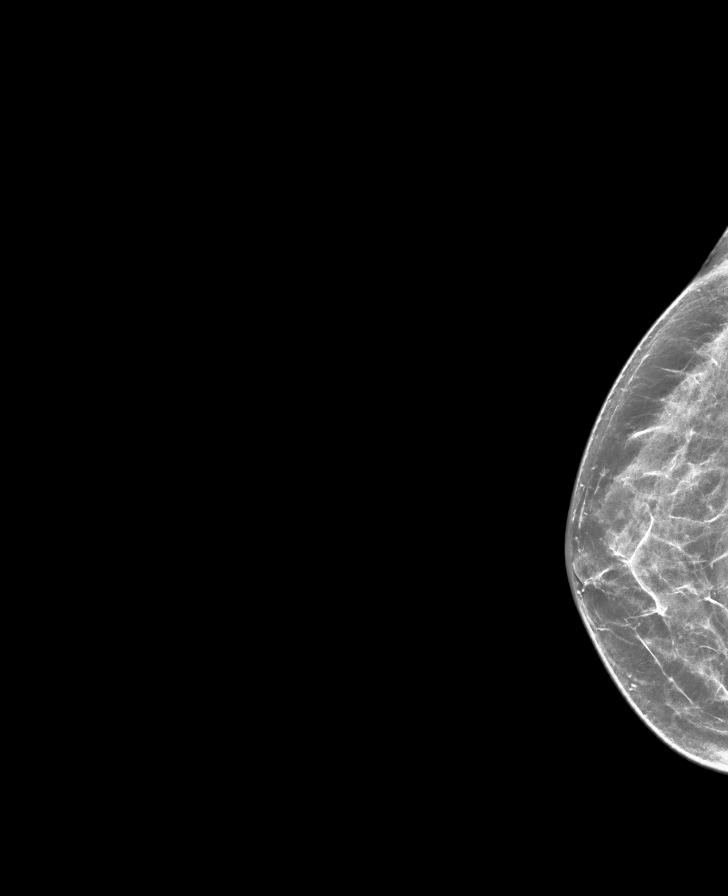

[L MLO synth-2D]
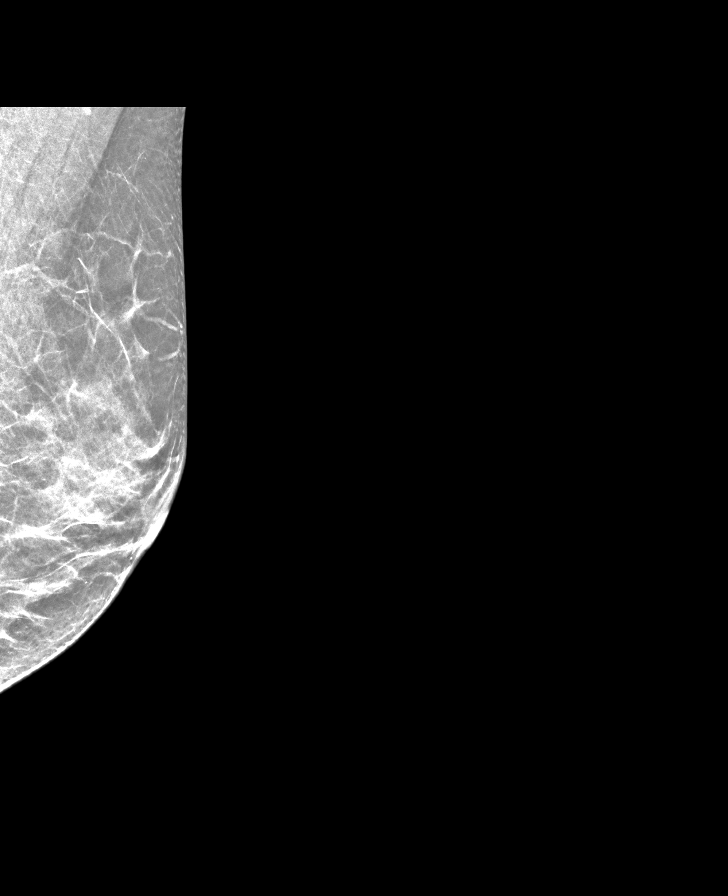

[R MLO synth-2D]
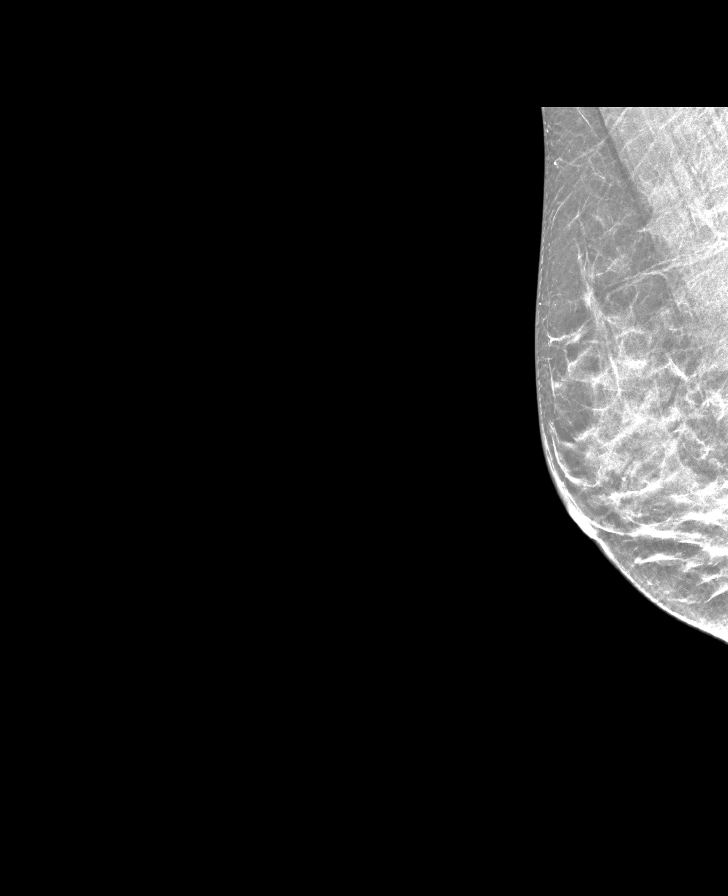

[R CC tomo · tomo slice 31/60.0]
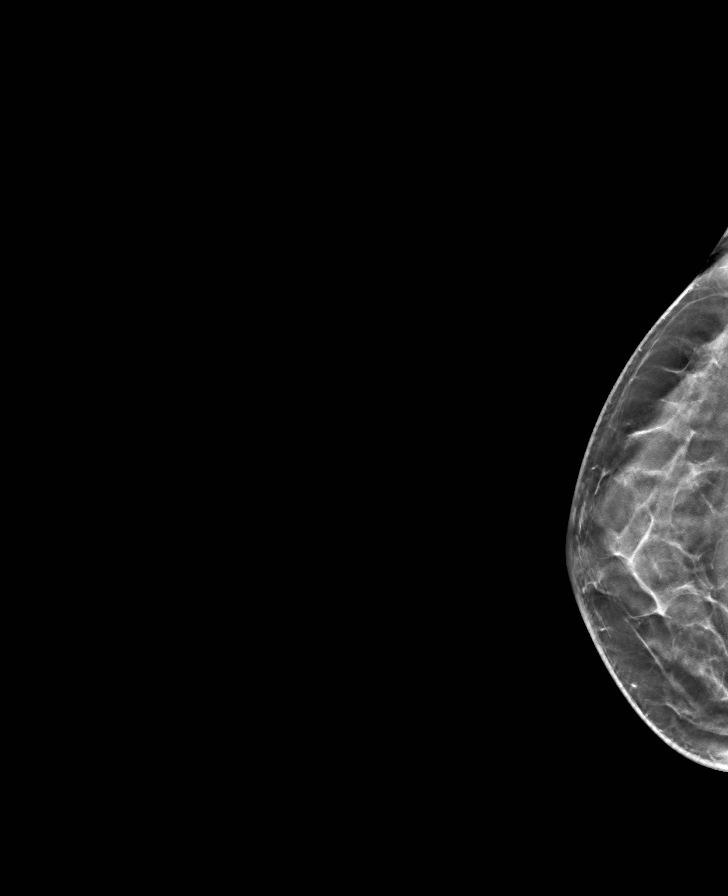

[L MLO tomo · tomo slice 25/48.0]
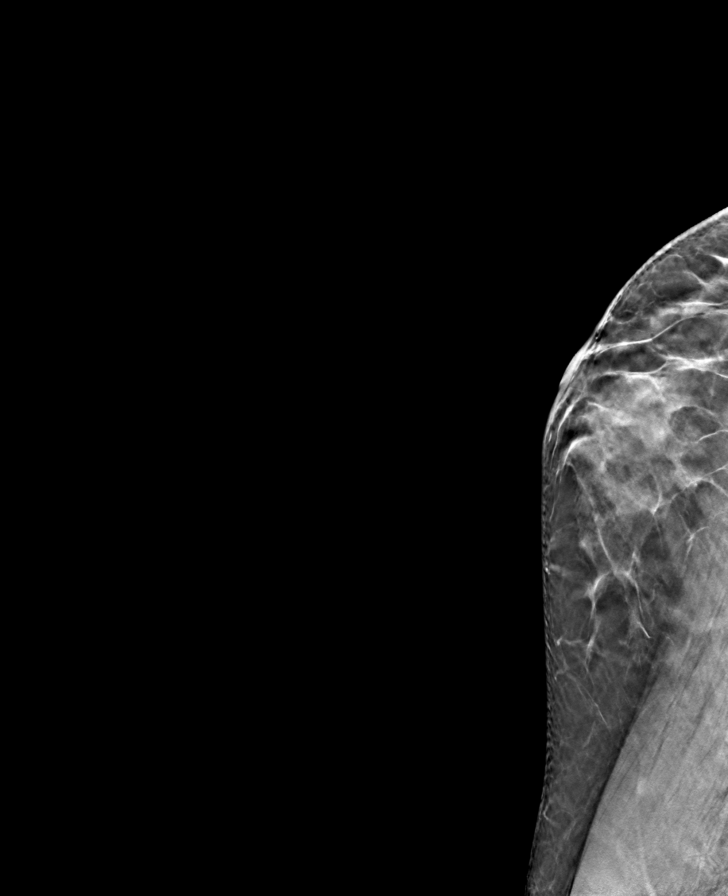

[L CC tomo · tomo slice 31/61.0]
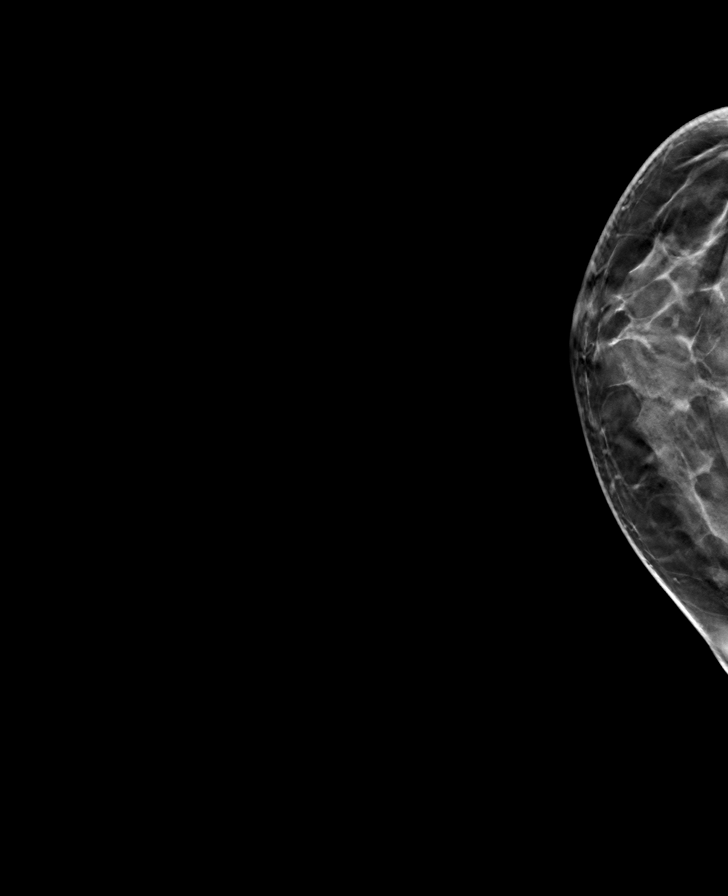

[R MLO tomo · tomo slice 27/52.0]
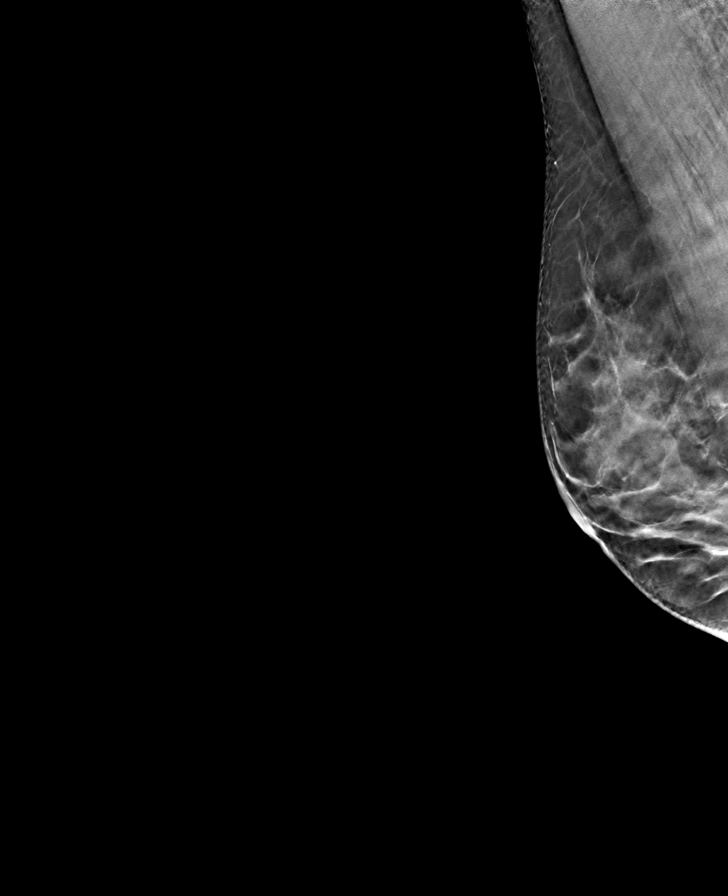

[8 of 24 positions shown; findings below may reference images not displayed]

ACR Breast Density Category c: The breast tissue is heterogeneously
dense, which may obscure small masses.
FINDINGS: There are no findings suspicious for malignancy. Images were
processed with CAD.
IMPRESSION: No mammographic evidence of malignancy. A result letter of this
screening mammogram will be mailed directly to the patient.

RECOMMENDATION:
Screening mammogram in one year. (Code:[5V])

BI-RADS CATEGORY  1: Negative.

## 2019-11-17 ENCOUNTER — Ambulatory Visit (INDEPENDENT_AMBULATORY_CARE_PROVIDER_SITE_OTHER): Payer: BLUE CROSS/BLUE SHIELD | Admitting: Obstetrics & Gynecology

## 2019-11-17 ENCOUNTER — Encounter: Payer: Self-pay | Admitting: Obstetrics & Gynecology

## 2019-11-17 ENCOUNTER — Other Ambulatory Visit: Payer: Self-pay

## 2019-11-17 VITALS — BP 120/70 | Ht 69.0 in | Wt 165.0 lb

## 2019-11-17 DIAGNOSIS — Z01419 Encounter for gynecological examination (general) (routine) without abnormal findings: Secondary | ICD-10-CM

## 2019-11-17 DIAGNOSIS — Z23 Encounter for immunization: Secondary | ICD-10-CM

## 2019-11-17 DIAGNOSIS — L900 Lichen sclerosus et atrophicus: Secondary | ICD-10-CM

## 2019-11-17 DIAGNOSIS — Z1211 Encounter for screening for malignant neoplasm of colon: Secondary | ICD-10-CM

## 2019-11-17 MED ORDER — CLOBETASOL PROPIONATE 0.05 % EX CREA: 1.0000 "application " | TOPICAL_CREAM | Freq: Two times a day (BID) | CUTANEOUS | 1 refills | Status: DC

## 2019-11-17 NOTE — Progress Notes (Signed)
HPI:      [name] is a 54 y.o. G1P1001 who LMP was in the past, she presents today for her annual examination.  The patient has no complaints today. The patient is sexually active. Herlast pap: approximate date 2019 and was normal and last mammogram: approximate date 09/2019 and was normal.  The patient does perform self breast exams.  There is no notable family history of breast or ovarian cancer in her family. The patient is not taking hormone replacement therapy. Patient denies post-menopausal vaginal bleeding.   The patient has regular exercise: yes. The patient denies current symptoms of depression.  Diagnosed last year w Lichen, Clobetasol helped intitial tx, one relapse since that was helped w 2 days of tx.    GYN Hx: Last Colonoscopy:2 years ago. Normal.  Last DEXA: never ago.    PMHx: Past Medical History:  Diagnosis Date  . Menorrhagia    Past Surgical History:  Procedure Laterality Date  . CESAREAN SECTION    . COLONOSCOPY WITH PROPOFOL N/A 12/10/2017   Procedure: COLONOSCOPY WITH PROPOFOL;  Surgeon: Lucilla Lame, MD;  Location: [facility] ENDOSCOPY;  Service: Endoscopy;  Laterality: N/A;  . DILATION AND CURETTAGE, DIAGNOSTIC / THERAPEUTIC    . HYSTEROSCOPY    . LAPAROSCOPIC SUPRACERVICAL HYSTERECTOMY     Family History  Problem Relation Age of Onset  . Diabetes Mother   . Osteoporosis Mother   . Skin cancer Mother   . Lung cancer Father   . Breast cancer Neg Hx    Social History   Tobacco Use  . Smoking status: Never Smoker  . Smokeless tobacco: Never Used  Substance Use Topics  . Alcohol use: No  . Drug use: No    Current Outpatient Medications:  .  clobetasol cream (TEMOVATE) 0.09 %, Apply 1 application topically 2 (two) times daily., Disp: 30 g, Rfl: 1 .  spironolactone (ALDACTONE) 50 MG tablet, Take by mouth., Disp: , Rfl:  Allergies: Aspirin, Penicillin v potassium, Salicylates, and Itraconazole  Review of Systems  Constitutional: Negative for  chills, fever and malaise/fatigue.  HENT: Negative for congestion, sinus pain and sore throat.   Eyes: Negative for blurred vision and pain.  Respiratory: Negative for cough and wheezing.   Cardiovascular: Negative for chest pain and leg swelling.  Gastrointestinal: Negative for abdominal pain, constipation, diarrhea, heartburn, nausea and vomiting.  Genitourinary: Negative for dysuria, frequency, hematuria and urgency.  Musculoskeletal: Negative for back pain, joint pain, myalgias and neck pain.  Skin: Negative for itching and rash.  Neurological: Negative for dizziness, tremors and weakness.  Endo/Heme/Allergies: Does not bruise/bleed easily.  Psychiatric/Behavioral: Negative for depression. The patient is not nervous/anxious and does not have insomnia.     Objective: BP 120/70   Ht 5\' 9"  (1.753 m)   Wt 165 lb (74.8 kg)   BMI 24.37 kg/m   Filed Weights   11/17/19 0805  Weight: 165 lb (74.8 kg)   Body mass index is 24.37 kg/m. Physical Exam Constitutional:      General: She is not in acute distress.    Appearance: She is well-developed.  Genitourinary:     Pelvic exam was performed with patient supine.     Urethra, bladder, vagina, uterus and rectum normal.     No lesions in the vagina.     No vaginal bleeding.     No cervical motion tenderness, friability, lesion or polyp.     Uterus is mobile.     Uterus is not enlarged.  No uterine mass detected.    Uterus is midaxial.     No right or left adnexal mass present.     Right adnexa not tender.     Left adnexa not tender.     Genitourinary Comments: No evidence for lichen on todays exam  HENT:     Head: Normocephalic and atraumatic. No laceration.     Right Ear: Hearing normal.     Left Ear: Hearing normal.     Mouth/Throat:     Pharynx: Uvula midline.  Eyes:     Pupils: Pupils are equal, round, and reactive to light.  Neck:     Thyroid: No thyromegaly.  Cardiovascular:     Rate and Rhythm: Normal rate and  regular rhythm.     Heart sounds: No murmur. No friction rub. No gallop.   Pulmonary:     Effort: Pulmonary effort is normal. No respiratory distress.     Breath sounds: Normal breath sounds. No wheezing.  Chest:     Breasts:        Right: No mass, skin change or tenderness.        Left: No mass, skin change or tenderness.  Abdominal:     General: Bowel sounds are normal. There is no distension.     Palpations: Abdomen is soft.     Tenderness: There is no abdominal tenderness. There is no rebound.  Musculoskeletal:        General: Normal range of motion.     Cervical back: Normal range of motion and neck supple.  Neurological:     Mental Status: She is alert and oriented to person, place, and time.     Cranial Nerves: No cranial nerve deficit.  Skin:    General: Skin is warm and [place].  Psychiatric:        Judgment: Judgment normal.  Vitals reviewed.     Assessment: Annual Exam 1. Women's annual routine gynecological examination   2. Lichen sclerosus   3. Screen for colon cancer     Plan:            1.  Cervical Screening-  Pap smear schedule reviewed with patient  2. Breast screening- Exam annually and mammogram scheduled  3. Colonoscopy every 10 years, Hemoccult testing after age 71  4. Labs managed by PCP  5. Counseling for hormonal therapy: none              6. FRAX - FRAX score for assessing the 10 year probability for fracture calculated and discussed today.  Based on age and score today, DEXA is not currently scheduled.   7. Lichen- cont PRN use of Clobetasol.  Refill done.    F/U  Return in about 1 year (around 11/16/2020) for Annual.  Annamarie Major, MD, [facility], [facility] 11/17/2019  8:34 AM

## 2019-11-17 NOTE — Patient Instructions (Signed)
Clobetasol Propionate skin cream What is this medicine? CLOBETASOL (kloe BAY ta sol) is a corticosteroid. It is used on the skin to treat itching, redness, and swelling caused by some skin conditions. This medicine may be used for other purposes; ask your health care provider or pharmacist if you have questions. COMMON BRAND NAME(S): Clobetavix, Cormax, Embeline, Embeline E, Impoyz, Tasoprol, Temovate, Temovate E What should I tell my health care provider before I take this medicine? They need to know if you have any of these conditions:  any type of active infection including measles, tuberculosis, herpes, or chickenpox  circulation problems or vascular disease  large areas of burned or damaged skin  rosacea  skin wasting or thinning  an unusual or allergic reaction to clobetasol, corticosteroids, other medicines, foods, dyes, or preservatives  pregnant or trying to get pregnant  breast-feeding How should I use this medicine? This medicine is for external use only. Do not take by mouth. Follow the directions on the prescription label. Wash your hands before and after use. Apply a thin film of medicine to the affected area. Do not cover with a bandage or dressing unless your doctor or health care professional tells you to. Do not get this medicine in your eyes. If you do, rinse out with plenty of cool tap water. It is important not to use more medicine than prescribed. Do not use your medicine more often than directed. To do so may increase the chance of side effects. Talk to your pediatrician regarding the use of this medicine in children. Special care may be needed. Elderly patients are more likely to have damaged skin through aging, and this may increase side effects. This medicine should only be used for brief periods and infrequently in older patients. Overdosage: If you think you have taken too much of this medicine contact a poison control center or emergency room at once. NOTE:  This medicine is only for you. Do not share this medicine with others. What if I miss a dose? If you miss a dose, use it as soon as you can. If it is almost time for your next dose, use only that dose. Do not use double or extra doses. What may interact with this medicine? Interactions are not expected. Do not use cosmetics or other skin care products on the treated area. This list may not describe all possible interactions. Give your health care provider a list of all the medicines, herbs, non-prescription drugs, or dietary supplements you use. Also tell them if you smoke, drink alcohol, or use illegal drugs. Some items may interact with your medicine. What should I watch for while using this medicine? Tell your doctor or health care professional if your symptoms do not get better within 2 weeks, or if you develop skin irritation from the medicine. Tell your doctor or health care professional if you are exposed to anyone with measles or chickenpox, or if you develop sores or blisters that do not heal properly. What side effects may I notice from receiving this medicine? Side effects that you should report to your doctor or health care professional as soon as possible:  allergic reactions like skin rash, itching or hives, swelling of the face, lips, or tongue  changes in vision  lack of healing of the skin condition  painful, red, pus filled blisters on the skin or in hair follicles  thinning of the skin with easy bruising Side effects that usually do not require medical attention (report to your doctor or   health care professional if they continue or are bothersome):  burning, irritation of the skin  redness or scaling of the skin This list may not describe all possible side effects. Call your doctor for medical advice about side effects. You may report side effects to FDA at 1-800-FDA-1088. Where should I keep my medicine? Keep out of the reach of children. Store at room temperature  between 15 and 30 degrees C (59 and 86 degrees F). Keep away from heat and direct light. Do not freeze. Throw away any unused medicine after the expiration date. NOTE: This sheet is a summary. It may not cover all possible information. If you have questions about this medicine, talk to your doctor, pharmacist, or health care provider.  2020 Elsevier/Gold Standard (2008-01-21 16:56:45)  

## 2020-02-06 LAB — FECAL OCCULT BLOOD, IMMUNOCHEMICAL: Fecal Occult Bld: NEGATIVE

## 2020-09-28 ENCOUNTER — Other Ambulatory Visit: Payer: Self-pay | Admitting: Obstetrics & Gynecology

## 2020-09-28 DIAGNOSIS — Z1231 Encounter for screening mammogram for malignant neoplasm of breast: Secondary | ICD-10-CM

## 2020-10-13 ENCOUNTER — Ambulatory Visit
Admission: RE | Admit: 2020-10-13 | Discharge: 2020-10-13 | Disposition: A | Discharge status: Home / Self Care | Payer: BC Managed Care – PPO | Source: Ambulatory Visit | Attending: Obstetrics & Gynecology | Admitting: Obstetrics & Gynecology

## 2020-10-13 ENCOUNTER — Other Ambulatory Visit: Payer: Self-pay

## 2020-10-13 DIAGNOSIS — Z1231 Encounter for screening mammogram for malignant neoplasm of breast: Secondary | ICD-10-CM | POA: Diagnosis present

## 2020-10-13 IMAGING — MG DIGITAL SCREENING BILAT W/ TOMO W/ CAD
8 series · 9 of 24 positions shown · non-contrast
Comparison: Previous exam(s).

CLINICAL DATA: Screening.

EXAM:
DIGITAL SCREENING BILATERAL MAMMOGRAM WITH TOMO AND CAD

[L CC synth-2D]
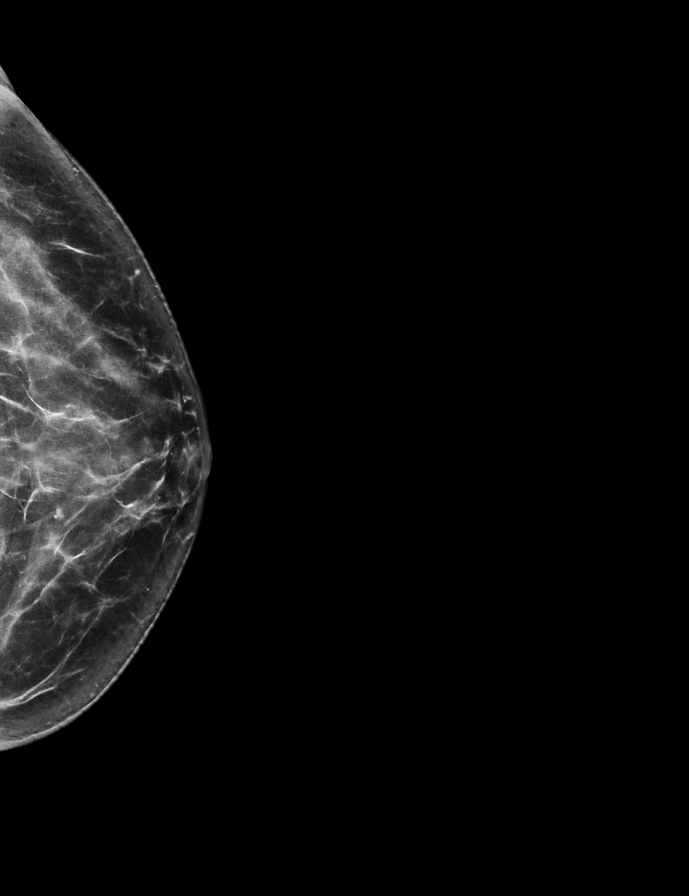

[R CC synth-2D]
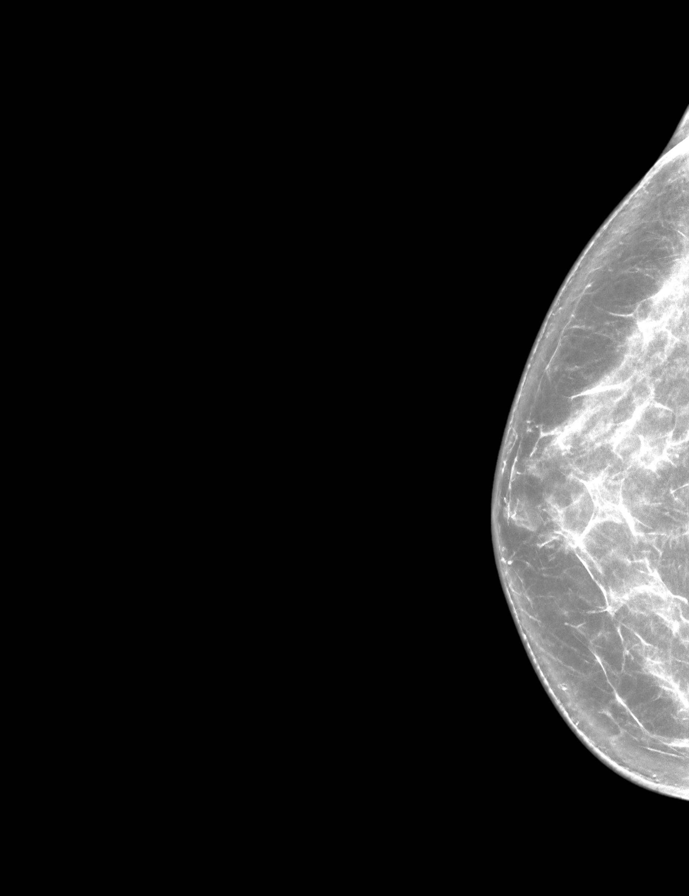

[L MLO synth-2D]
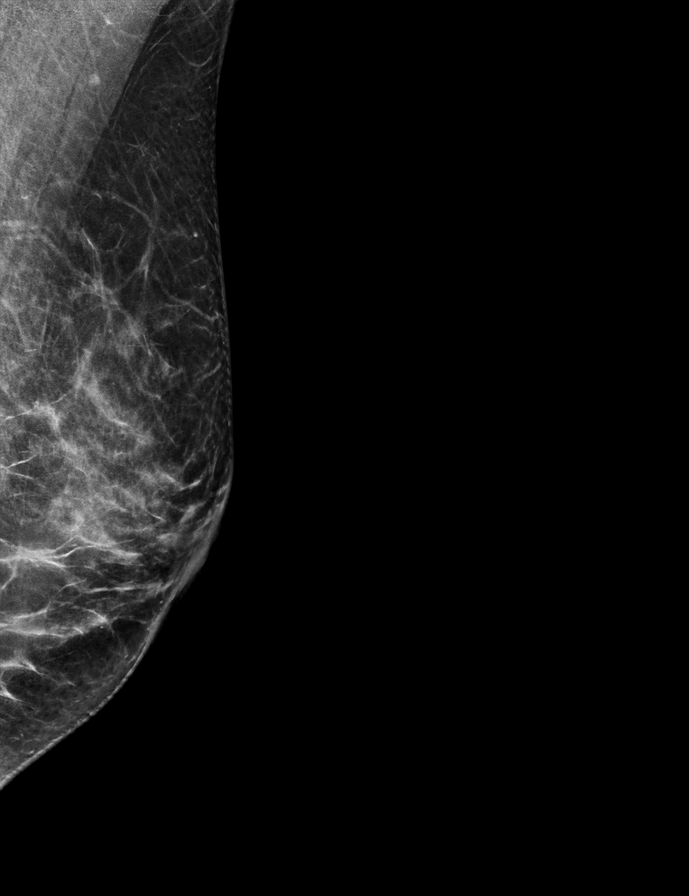

[R MLO synth-2D]
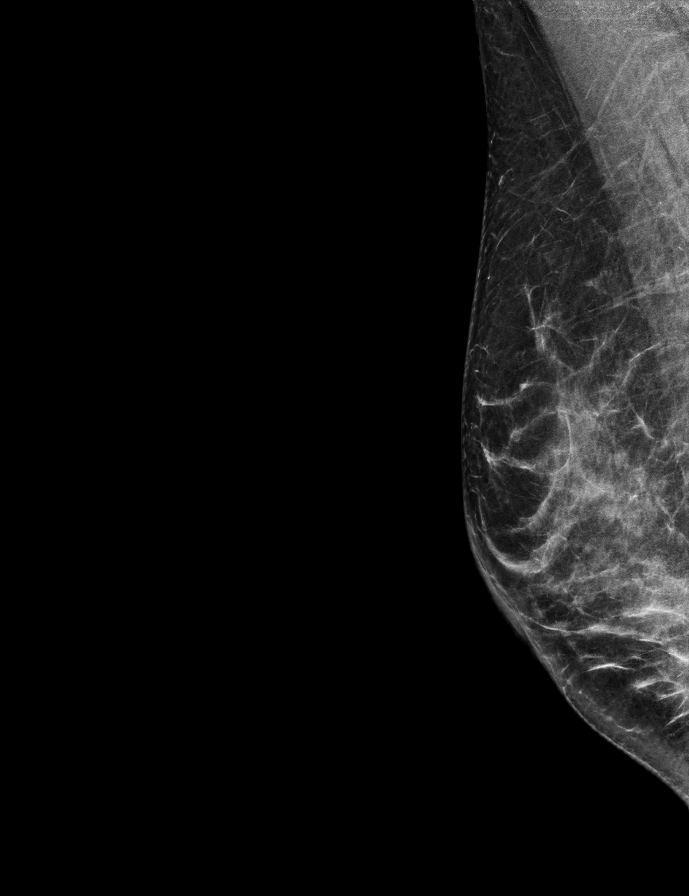

[R MLO tomo · 2 of 62 frames shown]
[frame 21/62]
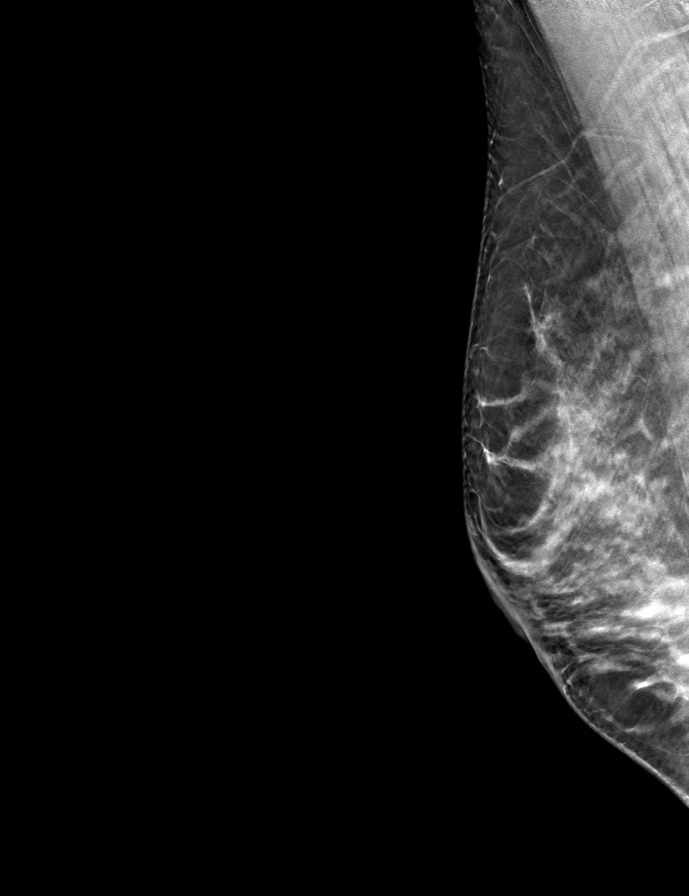
[frame 31/62]
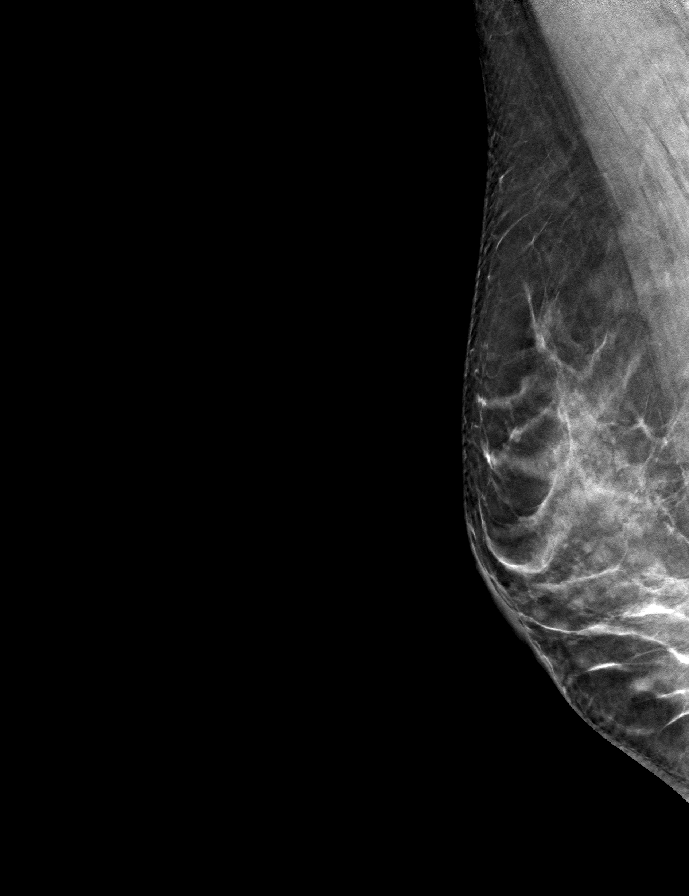

[L CC tomo · tomo slice 34/67.0]
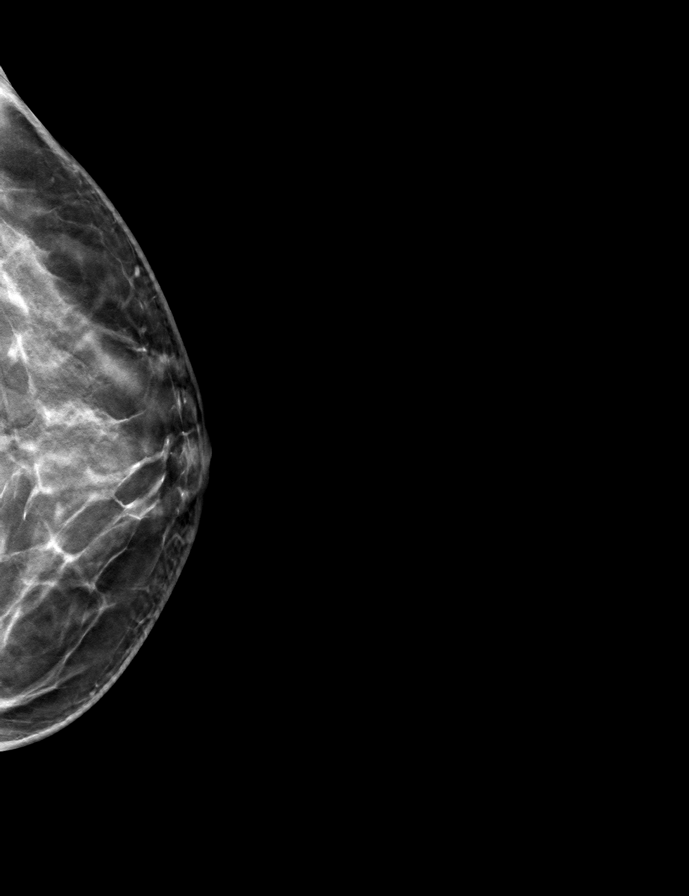

[L MLO tomo · tomo slice 29/56.0]
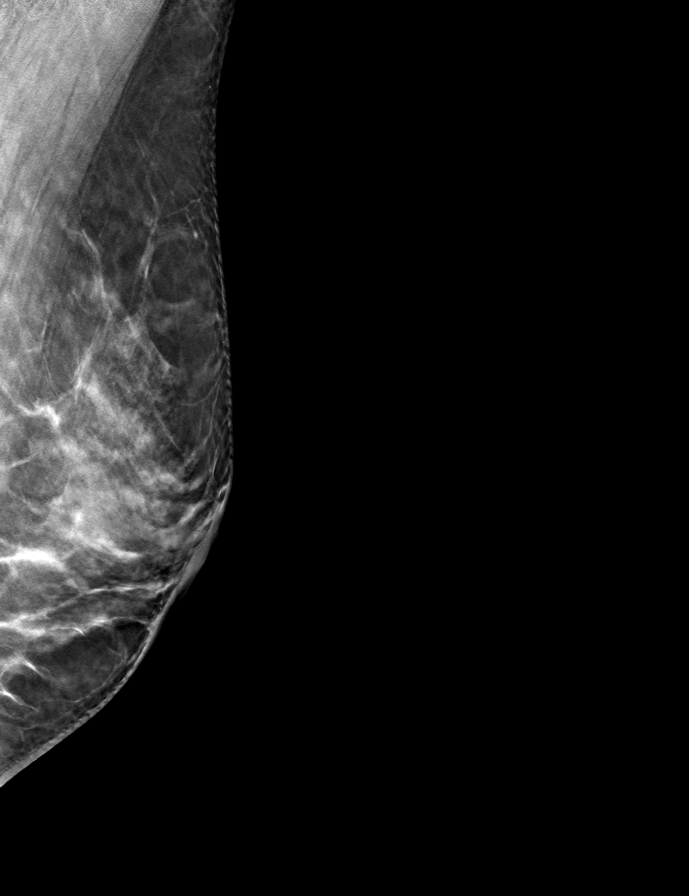

[R CC tomo · tomo slice 37/72.0]
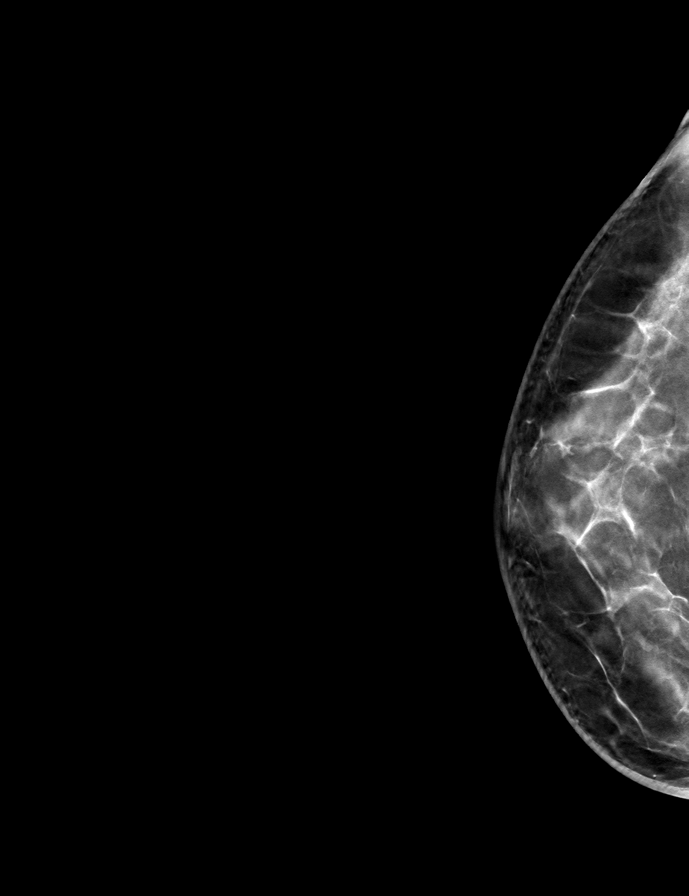

[9 of 24 positions shown; findings below may reference images not displayed]

ACR Breast Density Category c: The breast tissue is heterogeneously
dense, which may obscure small masses.
FINDINGS: There are no findings suspicious for malignancy. Images were
processed with CAD.
IMPRESSION: No mammographic evidence of malignancy. A result letter of this
screening mammogram will be mailed directly to the patient.

RECOMMENDATION:
Screening mammogram in one year. (Code:[5V])

BI-RADS CATEGORY  1: Negative.

## 2020-11-21 ENCOUNTER — Encounter: Payer: Self-pay | Admitting: Obstetrics & Gynecology

## 2020-11-21 ENCOUNTER — Ambulatory Visit (INDEPENDENT_AMBULATORY_CARE_PROVIDER_SITE_OTHER): Payer: BC Managed Care – PPO | Admitting: Obstetrics & Gynecology

## 2020-11-21 ENCOUNTER — Other Ambulatory Visit: Payer: Self-pay

## 2020-11-21 VITALS — BP 110/70 | Ht 69.0 in | Wt 170.0 lb

## 2020-11-21 DIAGNOSIS — Z1211 Encounter for screening for malignant neoplasm of colon: Secondary | ICD-10-CM

## 2020-11-21 DIAGNOSIS — Z23 Encounter for immunization: Secondary | ICD-10-CM | POA: Diagnosis not present

## 2020-11-21 DIAGNOSIS — Z01419 Encounter for gynecological examination (general) (routine) without abnormal findings: Secondary | ICD-10-CM | POA: Diagnosis not present

## 2020-11-21 DIAGNOSIS — L900 Lichen sclerosus et atrophicus: Secondary | ICD-10-CM | POA: Diagnosis not present

## 2020-11-21 MED ORDER — CLOBETASOL PROPIONATE 0.05 % EX CREA: 1.0000 "application " | TOPICAL_CREAM | Freq: Two times a day (BID) | CUTANEOUS | 1 refills | Status: DC

## 2020-11-21 NOTE — Progress Notes (Signed)
HPI:      [name] is a 55 y.o. G1P1001 who LMP was in the past, she presents today for her annual examination.  The patient has no complaints today.  No recurrence LICHEN SCLEROSUS this past year.  The patient is sexually active. Herlast pap: approximate date 2020 and was normal and last mammogram: approximate date 2021 and was normal.  The patient does perform self breast exams.  There is no notable family history of breast or ovarian cancer in her family. The patient is not taking hormone replacement therapy. Patient denies post-menopausal vaginal bleeding.   The patient has regular exercise: yes. The patient denies current symptoms of depression.    GYN Hx: Last Colonoscopy:3 years ago. Normal.  Last DEXA: never ago.    PMHx: Past Medical History:  Diagnosis Date  . Menorrhagia    Past Surgical History:  Procedure Laterality Date  . CESAREAN SECTION    . COLONOSCOPY WITH PROPOFOL N/A 12/10/2017   Procedure: COLONOSCOPY WITH PROPOFOL;  Surgeon: Midge Minium, MD;  Location: [facility] ENDOSCOPY;  Service: Endoscopy;  Laterality: N/A;  . DILATION AND CURETTAGE, DIAGNOSTIC / THERAPEUTIC    . HYSTEROSCOPY    . LAPAROSCOPIC SUPRACERVICAL HYSTERECTOMY     Family History  Problem Relation Age of Onset  . Diabetes Mother   . Osteoporosis Mother   . Skin cancer Mother   . Lung cancer Father   . Breast cancer Neg Hx    Social History   Tobacco Use  . Smoking status: Never Smoker  . Smokeless tobacco: Never Used  Vaping Use  . Vaping Use: Never used  Substance Use Topics  . Alcohol use: No  . Drug use: No    Current Outpatient Medications:  .  Calcium Carb-Cholecalciferol (OYSTER SHELL CALCIUM) 500-400 MG-UNIT TABS, Take 1 tablet by mouth daily., Disp: , Rfl:  .  Cholecalciferol 25 MCG (1000 UT) tablet, Take by mouth., Disp: , Rfl:  .  clobetasol cream (TEMOVATE) 0.05 %, Apply 1 application topically 2 (two) times daily., Disp: 30 g, Rfl: 1 .  levocetirizine (XYZAL) 5 MG  tablet, Take by mouth., Disp: , Rfl:  .  Lifitegrast (XIIDRA) 5 % SOLN, , Disp: , Rfl:  .  spironolactone (ALDACTONE) 50 MG tablet, Take by mouth., Disp: , Rfl:  .  XIIDRA 5 % SOLN, Instill 1 drop into both eyes twice a day, Disp: , Rfl:  Allergies: Aspirin, Penicillin v potassium, Salicylates, and Itraconazole  Review of Systems  Constitutional: Negative for chills, fever and malaise/fatigue.  HENT: Negative for congestion, sinus pain and sore throat.   Eyes: Negative for blurred vision and pain.  Respiratory: Negative for cough and wheezing.   Cardiovascular: Negative for chest pain and leg swelling.  Gastrointestinal: Negative for abdominal pain, constipation, diarrhea, heartburn, nausea and vomiting.  Genitourinary: Negative for dysuria, frequency, hematuria and urgency.  Musculoskeletal: Negative for back pain, joint pain, myalgias and neck pain.  Skin: Negative for itching and rash.  Neurological: Negative for dizziness, tremors and weakness.  Endo/Heme/Allergies: Does not bruise/bleed easily.  Psychiatric/Behavioral: Negative for depression. The patient is not nervous/anxious and does not have insomnia.     Objective: BP 110/70   Ht 5\' 9"  (1.753 m)   Wt 170 lb (77.1 kg)   BMI 25.10 kg/m   Filed Weights   11/21/20 1029  Weight: 170 lb (77.1 kg)   Body mass index is 25.1 kg/m. Physical Exam Constitutional:      General: She is not  in acute distress.    Appearance: She is well-developed and well-nourished.  Genitourinary:     Vagina, uterus and rectum normal.     There is no rash or lesion on the right labia.     There is no rash or lesion on the left labia.    No lesions in the vagina.     Right Labia: No rash, tenderness, lesions or skin changes.    Left Labia: No tenderness, lesions, skin changes or rash.    Vulva exam comments: No lichen changes noted.     No vaginal bleeding.      Right Adnexa: not tender and no mass present.    Left Adnexa: not tender and no  mass present.    No cervical motion tenderness, friability, lesion or polyp.     Uterus is mobile.     Uterus is not enlarged.     No uterine mass detected.    Uterus is midaxial.     Pelvic exam was performed with patient supine.  Breasts:     Right: No mass, skin change or tenderness.     Left: No mass, skin change or tenderness.    HENT:     Head: Normocephalic and atraumatic. No laceration.     Right Ear: Hearing normal.     Left Ear: Hearing normal.     Nose: No epistaxis or foreign body.     Mouth/Throat:     Mouth: Oropharynx is clear and moist and mucous membranes are normal.     Pharynx: Uvula midline.  Eyes:     Pupils: Pupils are equal, round, and reactive to light.  Neck:     Thyroid: No thyromegaly.  Cardiovascular:     Rate and Rhythm: Normal rate and regular rhythm.     Heart sounds: No murmur heard. No friction rub. No gallop.   Pulmonary:     Effort: Pulmonary effort is normal. No respiratory distress.     Breath sounds: Normal breath sounds. No wheezing.  Abdominal:     General: Bowel sounds are normal. There is no distension.     Palpations: Abdomen is soft.     Tenderness: There is no abdominal tenderness. There is no rebound.  Musculoskeletal:        General: Normal range of motion.     Cervical back: Normal range of motion and neck supple.  Neurological:     Mental Status: She is alert and oriented to person, place, and time.     Cranial Nerves: No cranial nerve deficit.  Skin:    General: Skin is warm and [place].  Psychiatric:        Mood and Affect: Mood and affect normal.        Judgment: Judgment normal.  Vitals reviewed.     Assessment: Annual Exam 1. Women's annual routine gynecological examination   2. Lichen sclerosus   3. Screen for colon cancer     Plan:            1.  Cervical Screening-  Pap smear schedule reviewed with patient  2. Breast screening- Exam annually and mammogram scheduled  3. Colonoscopy every 10 years,  Hemoccult testing after age 52  4. Labs managed by PCP  5. Counseling for hormonal therapy: none              6. FRAX - FRAX score for assessing the 10 year probability for fracture calculated and discussed today.  Based on age and score  today, DEXA is not currently scheduled.   7. Clobetasol prn for LS    F/U  Return in about 1 year (around 11/21/2021) for Annual.  Annamarie Major, MD, [facility], [facility] 11/21/2020  10:49 AM

## 2020-11-21 NOTE — Patient Instructions (Signed)
PAP every three years Mammogram every year Colonoscopy every 10 years Labs yearly (with PCP)  Thank you for choosing [facility]. As part of our ongoing efforts to improve patient experience, we would appreciate your feedback. Please fill out the short survey that you will receive by mail or MyChart. Your opinion is important to us! - Dr. Tova Vater  Recommendations to boost your immunity to prevent illness such as viral flu and colds, including covid19, are as follows: Vitamin K2 and Vitamin D3. Take Vitamin K2 at 200-300 mcg daily (usually 2-3 pills daily of the over the counter formulation). Take Vitamin D3 at 3000-4000 U daily (usually 3-4 pills daily of the over the counter formulation). Studies show that these two at high normal levels in your system are very effective in keeping your immunity so strong and protective that you will be unlikely to contract viral illness such as those listed above.  Dr Akram Kissick  

## 2021-06-24 LAB — FECAL OCCULT BLOOD, IMMUNOCHEMICAL: Fecal Occult Bld: NEGATIVE

## 2021-08-14 ENCOUNTER — Other Ambulatory Visit
Admission: RE | Admit: 2021-08-14 | Discharge: 2021-08-14 | Disposition: A | Discharge status: Home / Self Care | Payer: BC Managed Care – PPO | Source: Ambulatory Visit | Attending: Family Medicine | Admitting: Family Medicine

## 2021-08-14 DIAGNOSIS — R079 Chest pain, unspecified: Secondary | ICD-10-CM | POA: Insufficient documentation

## 2021-08-14 DIAGNOSIS — J019 Acute sinusitis, unspecified: Secondary | ICD-10-CM | POA: Insufficient documentation

## 2021-08-14 DIAGNOSIS — R1013 Epigastric pain: Secondary | ICD-10-CM | POA: Insufficient documentation

## 2021-08-14 DIAGNOSIS — B9689 Other specified bacterial agents as the cause of diseases classified elsewhere: Secondary | ICD-10-CM | POA: Insufficient documentation

## 2021-08-14 LAB — TROPONIN I (HIGH SENSITIVITY): Troponin I (High Sensitivity): <2 ng/L (ref <18)

## 2021-08-14 LAB — D-DIMER, QUANTITATIVE: D-Dimer, Quant: 0.3 ug/mL-FEU (ref 0.00–0.50)

## 2021-09-01 ENCOUNTER — Other Ambulatory Visit: Payer: Self-pay | Admitting: Infectious Diseases

## 2021-09-01 DIAGNOSIS — Z1231 Encounter for screening mammogram for malignant neoplasm of breast: Secondary | ICD-10-CM

## 2021-10-16 ENCOUNTER — Other Ambulatory Visit: Payer: Self-pay

## 2021-10-16 ENCOUNTER — Ambulatory Visit
Admission: RE | Admit: 2021-10-16 | Discharge: 2021-10-16 | Disposition: A | Discharge status: Home / Self Care | Payer: 59 | Source: Ambulatory Visit | Attending: Infectious Diseases | Admitting: Infectious Diseases

## 2021-10-16 DIAGNOSIS — Z1231 Encounter for screening mammogram for malignant neoplasm of breast: Secondary | ICD-10-CM | POA: Diagnosis present

## 2021-10-16 IMAGING — MG MM DIGITAL SCREENING BILAT W/ TOMO AND CAD
6 of 10 series · 6 of 30 positions shown · non-contrast
Comparison: Previous exam(s).

CLINICAL DATA: Screening.

EXAM:
DIGITAL SCREENING BILATERAL MAMMOGRAM WITH TOMOSYNTHESIS AND CAD
TECHNIQUE: Bilateral screening digital craniocaudal and mediolateral oblique
mammograms were obtained. Bilateral screening digital breast
tomosynthesis was performed. The images were evaluated with
computer-aided detection.

[R MLO synth-2D (1 of 2)]
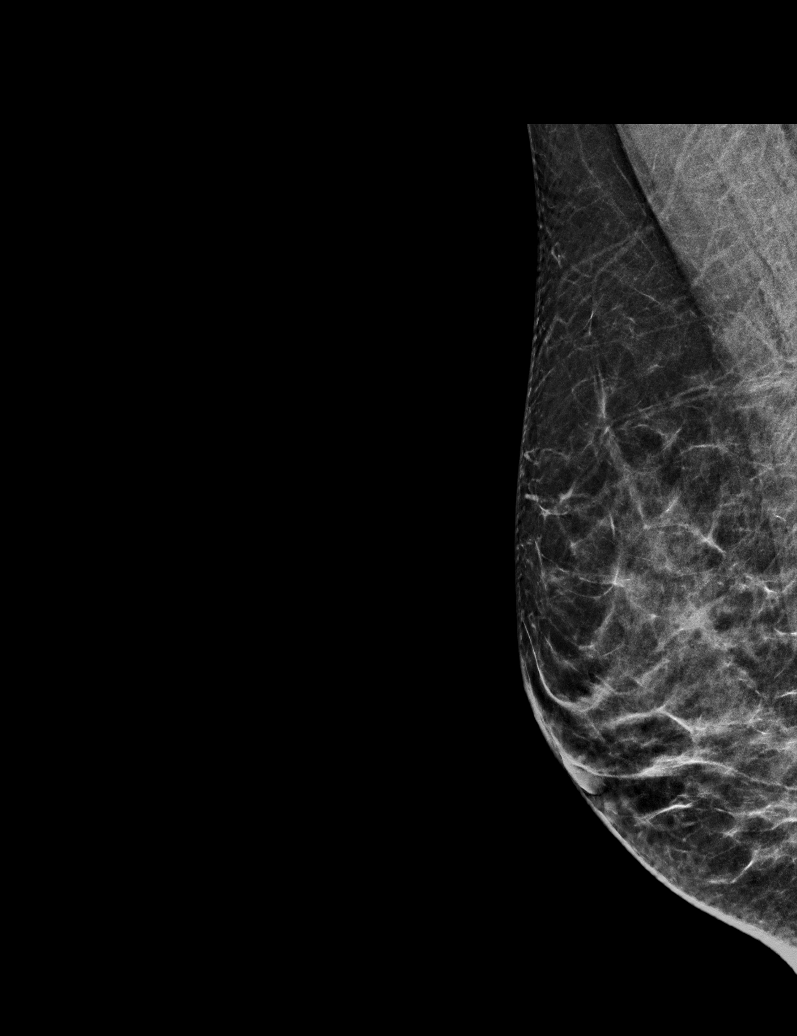

[L CC synth-2D]
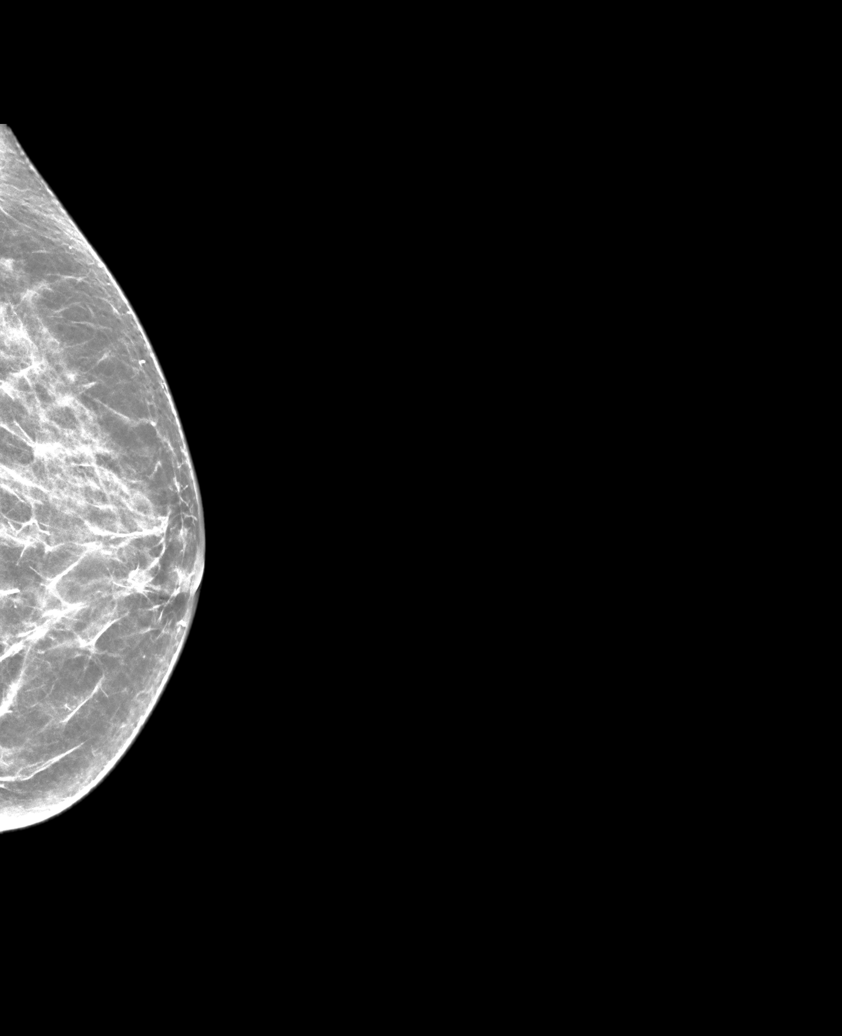

[R CC synth-2D]
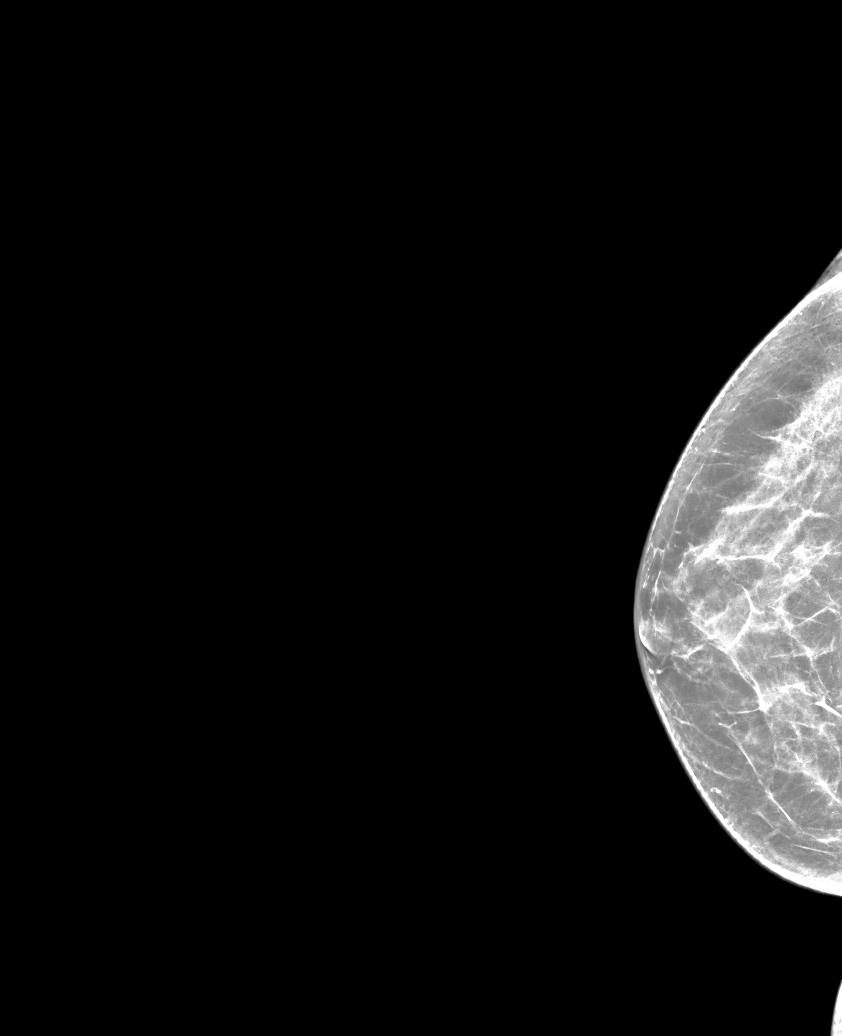

[L MLO synth-2D]
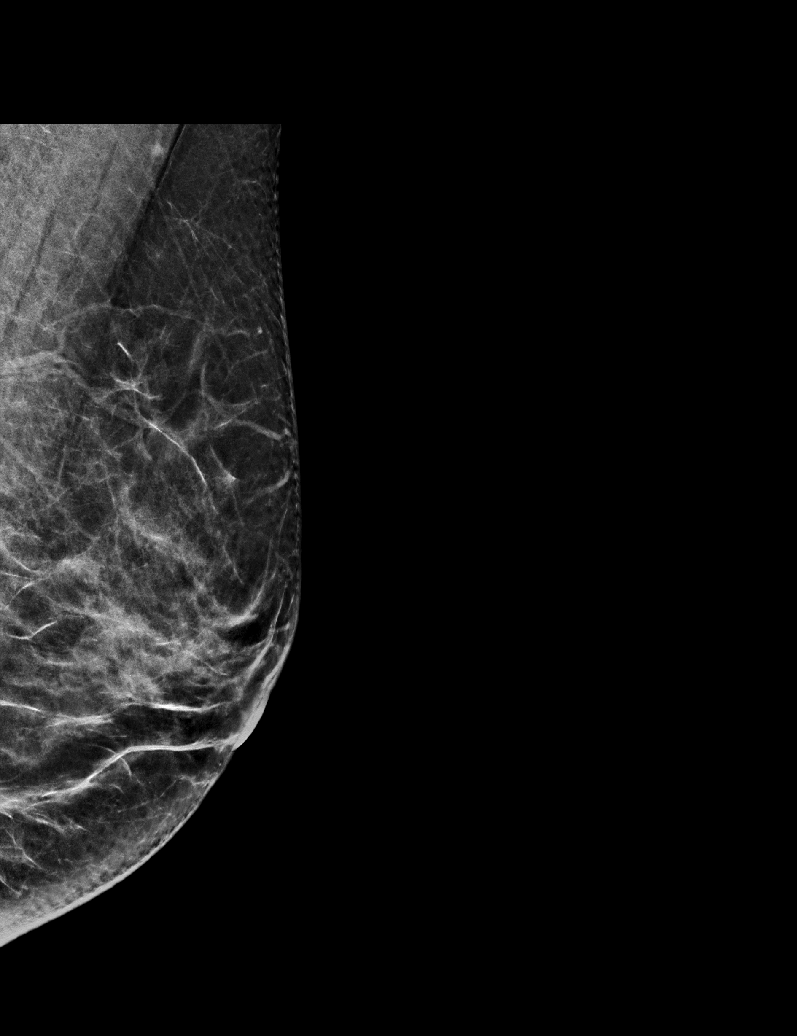

[R MLO synth-2D (2 of 2)]
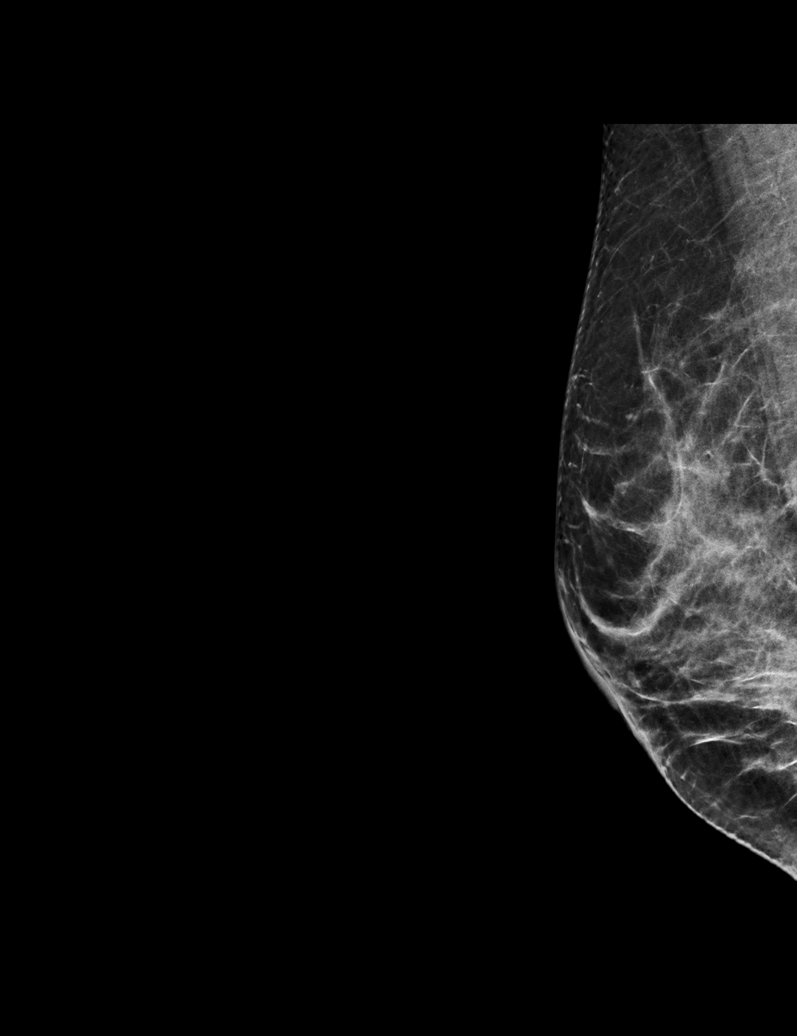

[L MLO tomo · tomo slice 28/55.0]
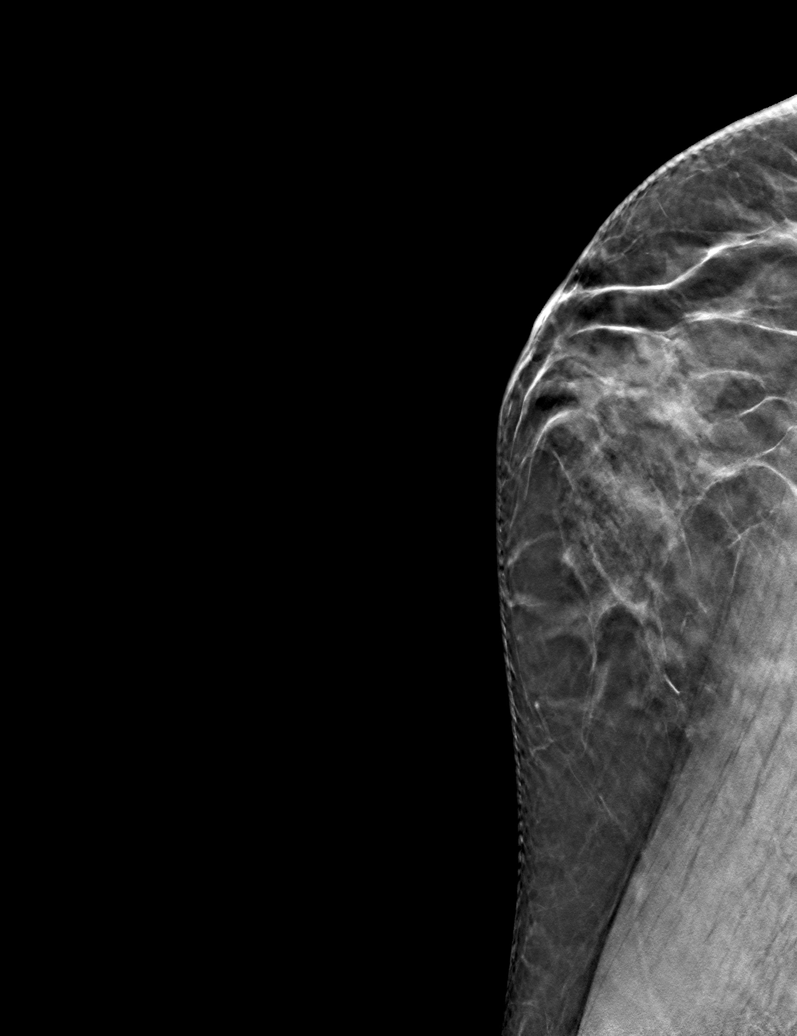

[6 of 30 positions shown; findings below may reference images not displayed]

ACR Breast Density Category c: The breast tissue is heterogeneously
dense, which may obscure small masses.
FINDINGS: There are no findings suspicious for malignancy.
IMPRESSION: No mammographic evidence of malignancy. A result letter of this
screening mammogram will be mailed directly to the patient.

RECOMMENDATION:
Screening mammogram in one year. (Code:[V2])

BI-RADS CATEGORY  1: Negative.

## 2021-11-24 ENCOUNTER — Ambulatory Visit (INDEPENDENT_AMBULATORY_CARE_PROVIDER_SITE_OTHER): Payer: 59 | Admitting: Obstetrics & Gynecology

## 2021-11-24 ENCOUNTER — Other Ambulatory Visit: Payer: Self-pay

## 2021-11-24 ENCOUNTER — Other Ambulatory Visit (HOSPITAL_COMMUNITY)
Admission: RE | Admit: 2021-11-24 | Discharge: 2021-11-24 | Disposition: A | Discharge status: Home / Self Care | Payer: 59 | Source: Ambulatory Visit | Attending: Obstetrics & Gynecology | Admitting: Obstetrics & Gynecology

## 2021-11-24 ENCOUNTER — Encounter: Payer: Self-pay | Admitting: Obstetrics & Gynecology

## 2021-11-24 VITALS — BP 100/70 | Ht 69.0 in | Wt 172.0 lb

## 2021-11-24 DIAGNOSIS — L900 Lichen sclerosus et atrophicus: Secondary | ICD-10-CM

## 2021-11-24 DIAGNOSIS — Z124 Encounter for screening for malignant neoplasm of cervix: Secondary | ICD-10-CM

## 2021-11-24 DIAGNOSIS — Z01419 Encounter for gynecological examination (general) (routine) without abnormal findings: Secondary | ICD-10-CM | POA: Diagnosis not present

## 2021-11-24 MED ORDER — CLOBETASOL PROPIONATE 0.05 % EX CREA: 1.0000 "application " | TOPICAL_CREAM | Freq: Two times a day (BID) | CUTANEOUS | 1 refills | Status: AC

## 2021-11-24 NOTE — Progress Notes (Signed)
HPI:      [name] is a 56 y.o. G1P1001 who LMP was in the past (prior [facility]), she presents today for her annual examination.  The patient has no complaints today. The patient is sexually active. Herlast pap: approximate date 2020 and was normal and last mammogram: approximate date 09/2021 and was normal.  The patient does perform self breast exams.  There is notable family history of breast or ovarian cancer in her family. The patient is not taking hormone replacement therapy. Patient denies post-menopausal vaginal bleeding.   The patient has regular exercise: yes. The patient denies current symptoms of depression.    GYN Hx: Last Colonoscopy:4 years ago. Normal.  Last DEXA:  never  ago.    PMHx: Past Medical History:  Diagnosis Date   Menorrhagia    Past Surgical History:  Procedure Laterality Date   CESAREAN SECTION     COLONOSCOPY WITH PROPOFOL N/A 12/10/2017   Procedure: COLONOSCOPY WITH PROPOFOL;  Surgeon: Lucilla Lame, MD;  Location: [facility] ENDOSCOPY;  Service: Endoscopy;  Laterality: N/A;   DILATION AND CURETTAGE, DIAGNOSTIC / THERAPEUTIC     HYSTEROSCOPY     LAPAROSCOPIC SUPRACERVICAL HYSTERECTOMY     Family History  Problem Relation Age of Onset   Diabetes Mother    Osteoporosis Mother    Skin cancer Mother    Lung cancer Father    Breast cancer Neg Hx    Social History   Tobacco Use   Smoking status: Never   Smokeless tobacco: Never  Vaping Use   Vaping Use: Never used  Substance Use Topics   Alcohol use: No   Drug use: No    Current Outpatient Medications:    Calcium Carb-Cholecalciferol (OYSTER SHELL CALCIUM) 500-400 MG-UNIT TABS, Take 1 tablet by mouth daily., Disp: , Rfl:    Cholecalciferol 25 MCG (1000 UT) tablet, Take by mouth., Disp: , Rfl:    levocetirizine (XYZAL) 5 MG tablet, Take by mouth., Disp: , Rfl:    omeprazole (PRILOSEC) 40 MG capsule, Take by mouth., Disp: , Rfl:    XIIDRA 5 % SOLN, Instill 1 drop into both eyes twice a day, Disp: ,  Rfl:    clobetasol cream (TEMOVATE) [id number] %, Apply 1 application topically 2 (two) times daily., Disp: 30 g, Rfl: 1 Allergies: Aspirin, Penicillin v potassium, Salicylates, and Itraconazole  Review of Systems  Constitutional:  Negative for chills, fever and malaise/fatigue.  HENT:  Negative for congestion, sinus pain and sore throat.   Eyes:  Negative for blurred vision and pain.  Respiratory:  Negative for cough and wheezing.   Cardiovascular:  Negative for chest pain and leg swelling.  Gastrointestinal:  Negative for abdominal pain, constipation, diarrhea, heartburn, nausea and vomiting.  Genitourinary:  Negative for dysuria, frequency, hematuria and urgency.  Musculoskeletal:  Negative for back pain, joint pain, myalgias and neck pain.  Skin:  Negative for itching and rash.  Neurological:  Negative for dizziness, tremors and weakness.  Endo/Heme/Allergies:  Does not bruise/bleed easily.  Psychiatric/Behavioral:  Negative for depression. The patient is not nervous/anxious and does not have insomnia.    Objective: BP 100/70    Ht 5\' 9"  (1.753 m)    Wt 172 lb (78 kg)    BMI 25.40 kg/m   Filed Weights   11/24/21 0822  Weight: 172 lb (78 kg)   Body mass index is 25.4 kg/m. Physical Exam Constitutional:      General: She is not in acute distress.  Appearance: She is well-developed.  Genitourinary:     Bladder, rectum and urethral meatus normal.     No lesions in the vagina.     Right Labia: No rash, tenderness or lesions.    Left Labia: No tenderness, lesions or rash.    No vaginal bleeding.      Right Adnexa: not tender and no mass present.    Left Adnexa: not tender and no mass present.    No cervical motion tenderness, friability, lesion or polyp.     Uterus is absent.     Pelvic exam was performed with patient in the lithotomy position.  Breasts:    Right: No mass, skin change or tenderness.     Left: No mass, skin change or tenderness.  HENT:     Head: Normocephalic  and atraumatic. No laceration.     Right Ear: Hearing normal.     Left Ear: Hearing normal.     Mouth/Throat:     Pharynx: Uvula midline.  Eyes:     Pupils: Pupils are equal, round, and reactive to light.  Neck:     Thyroid: No thyromegaly.  Cardiovascular:     Rate and Rhythm: Normal rate and regular rhythm.     Heart sounds: No murmur heard.   No friction rub. No gallop.  Pulmonary:     Effort: Pulmonary effort is normal. No respiratory distress.     Breath sounds: Normal breath sounds. No wheezing.  Abdominal:     General: Bowel sounds are normal. There is no distension.     Palpations: Abdomen is soft.     Tenderness: There is no abdominal tenderness. There is no rebound.  Musculoskeletal:        General: Normal range of motion.     Cervical back: Normal range of motion and neck supple.  Neurological:     Mental Status: She is alert and oriented to person, place, and time.     Cranial Nerves: No cranial nerve deficit.  Skin:    General: Skin is warm and [place].  Psychiatric:        Judgment: Judgment normal.  Vitals reviewed.    Assessment: Annual Exam 1. Women's annual routine gynecological examination   2. Lichen sclerosus   3. Screening for cervical cancer     Plan:            1.  Cervical Screening-  Pap smear done today  2. Breast screening- Exam annually and mammogram scheduled  3. Colonoscopy every 10 years, Hemoccult testing after age 67  4. Labs managed by PCP  5. Counseling for hormonal therapy: none              6. FRAX - FRAX score for assessing the 10 year probability for fracture calculated and discussed today.  Based on age and score today, DEXA is not scheduled.   7. Lichen- no worsening signs    F/U  Return for and Annual when due.  Barnett Applebaum, MD, [facility], [facility] 11/24/2021  8:52 AM

## 2021-11-24 NOTE — Patient Instructions (Signed)
PAP every three years Mammogram every year    Call [phone] to schedule at [place] Colonoscopy every 10 years Labs yearly (with PCP)  Thank you for choosing [facility]. As part of our ongoing efforts to improve patient experience, we would appreciate your feedback. Please fill out the short survey that you will receive by mail or MyChart. Your opinion is important to us! - Dr. Harlean Regula  Recommendations to boost your immunity to prevent illness such as viral flu and colds, including covid19, are as follows:       - - -  Vitamin K2 and Vitamin D3  - - - Take Vitamin K2 at 200-300 mcg daily (usually 2-3 pills daily of the over the counter formulation). Take Vitamin D3 at 3000-4000 U daily (usually 3-4 pills daily of the over the counter formulation). Studies show that these two at high normal levels in your system are very effective in keeping your immunity so strong and protective that you will be unlikely to contract viral illness such as those listed above.  Dr Gwendolyn Nishi  

## 2021-11-27 LAB — CYTOLOGY - PAP
Comment: NEGATIVE
Diagnosis: NEGATIVE
High risk HPV: NEGATIVE

## 2021-12-05 ENCOUNTER — Other Ambulatory Visit (HOSPITAL_COMMUNITY): Payer: Self-pay | Admitting: Physician Assistant

## 2021-12-05 ENCOUNTER — Other Ambulatory Visit: Payer: Self-pay | Admitting: Physician Assistant

## 2021-12-05 DIAGNOSIS — R200 Anesthesia of skin: Secondary | ICD-10-CM

## 2021-12-08 ENCOUNTER — Other Ambulatory Visit: Payer: Self-pay

## 2021-12-08 ENCOUNTER — Emergency Department
Admission: EM | Admit: 2021-12-08 | Discharge: 2021-12-08 | Disposition: A | Discharge status: Home / Self Care | Payer: 59 | Attending: Emergency Medicine | Admitting: Emergency Medicine

## 2021-12-08 ENCOUNTER — Emergency Department: Payer: 59

## 2021-12-08 ENCOUNTER — Encounter: Payer: Self-pay | Admitting: Emergency Medicine

## 2021-12-08 DIAGNOSIS — R42 Dizziness and giddiness: Secondary | ICD-10-CM | POA: Insufficient documentation

## 2021-12-08 DIAGNOSIS — R200 Anesthesia of skin: Secondary | ICD-10-CM

## 2021-12-08 DIAGNOSIS — R208 Other disturbances of skin sensation: Secondary | ICD-10-CM | POA: Insufficient documentation

## 2021-12-08 LAB — URINALYSIS, ROUTINE W REFLEX MICROSCOPIC
Bilirubin Urine: NEGATIVE
Glucose, UA: NEGATIVE mg/dL
Hgb urine dipstick: NEGATIVE
Ketones, ur: NEGATIVE mg/dL
Leukocytes,Ua: NEGATIVE
Nitrite: NEGATIVE
Protein, ur: NEGATIVE mg/dL
Specific Gravity, Urine: 1.003 — ABNORMAL LOW (ref 1.005–1.030)
pH: 7 (ref 5.0–8.0)

## 2021-12-08 LAB — CBC
HCT: 43 % (ref 36.0–46.0)
Hemoglobin: 14.1 g/dL (ref 12.0–15.0)
MCH: 28.2 pg (ref 26.0–34.0)
MCHC: 32.8 g/dL (ref 30.0–36.0)
MCV: 86 fL (ref 80.0–100.0)
Platelets: 183 10*3/uL (ref 150–400)
RBC: 5 MIL/uL (ref 3.87–5.11)
RDW: 13 % (ref 11.5–15.5)
WBC: 5.7 10*3/uL (ref 4.0–10.5)
nRBC: 0 % (ref 0.0–0.2)

## 2021-12-08 LAB — TROPONIN I (HIGH SENSITIVITY): Troponin I (High Sensitivity): <2 ng/L (ref <18)

## 2021-12-08 LAB — BASIC METABOLIC PANEL
Anion gap: 10 (ref 5–15)
BUN: 13 mg/dL (ref 6–20)
CO2: 29 mmol/L (ref 22–32)
Calcium: 9.3 mg/dL (ref 8.9–10.3)
Chloride: 107 mmol/L (ref 98–111)
Creatinine, Ser: 1.06 mg/dL — ABNORMAL HIGH (ref 0.44–1.00)
GFR, Estimated: >60 mL/min (ref >60)
Glucose, Bld: 107 mg/dL — ABNORMAL HIGH (ref 70–99)
Potassium: 4.2 mmol/L (ref 3.5–5.1)
Sodium: 146 mmol/L — ABNORMAL HIGH (ref 135–145)

## 2021-12-08 IMAGING — CT CT HEAD W/O CM
4 series · 16 of 47 positions shown, 18 images · non-contrast
Comparison: No pertinent prior exams available for comparison.

CLINICAL DATA: Provided history: Dizziness, nonspecific. Additional
history provided: Dizziness. Numbness in arms and hands (right
greater than left).



[Series 2: head bone · axial · 0.43mm/px · z∈[+440,+472]mm · 3 of 81 slices shown]
[im 9/81  bone]
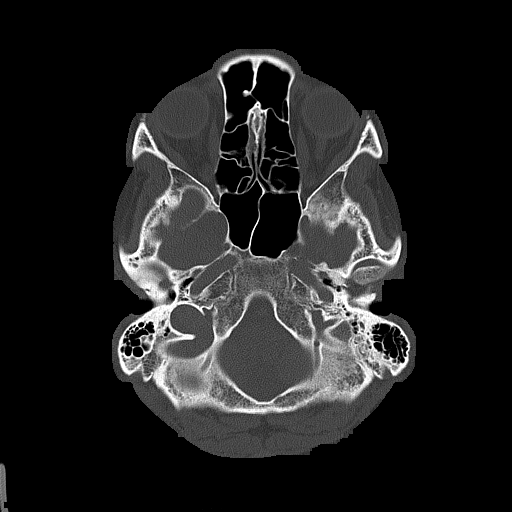
[im 17/81  bone]
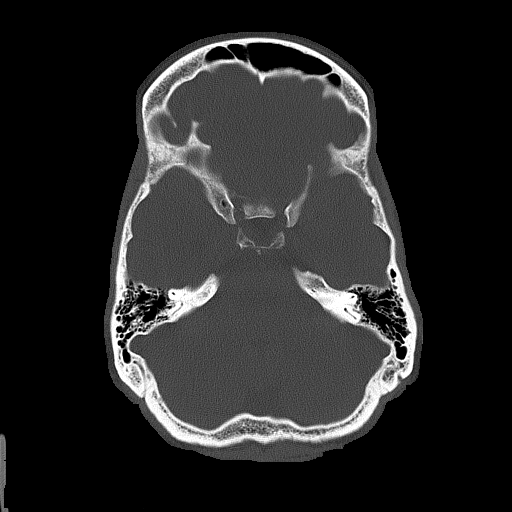
[im 25/81  bone]
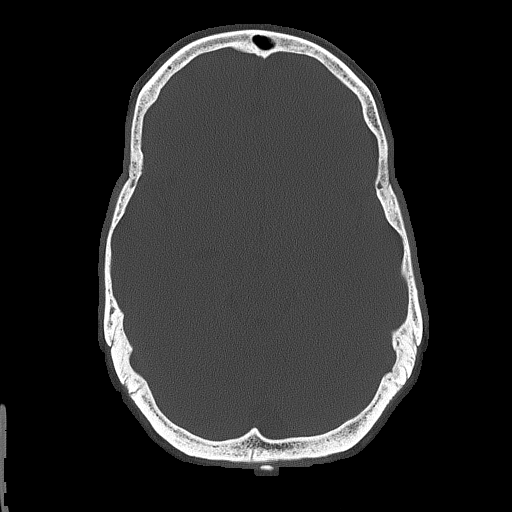

[Series 3: head wo · axial · 0.43mm/px · z∈[+444,+564]mm · 7 of 33 slices shown, 9 images]
[im 5/33  brain]
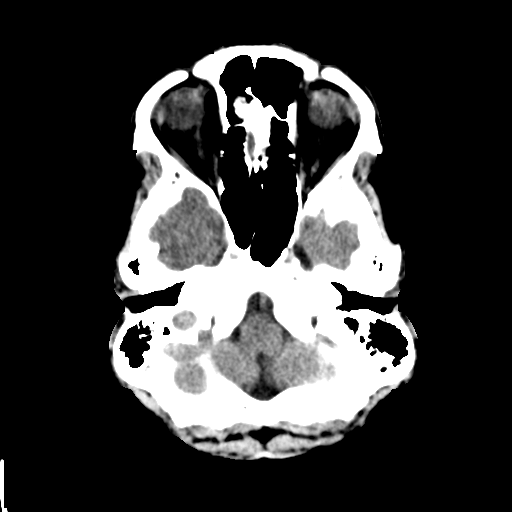
[im 5/33  bone]
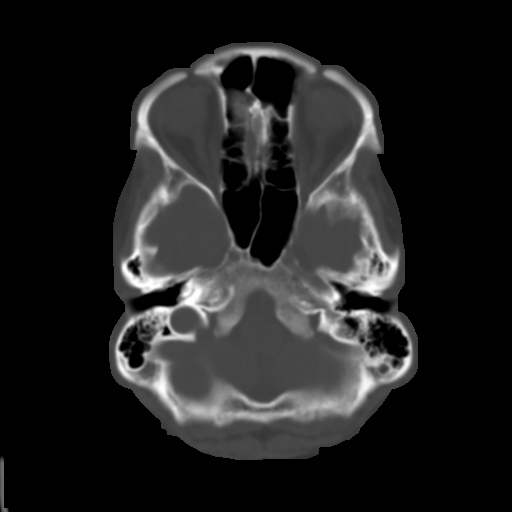
[im 9/33  brain]
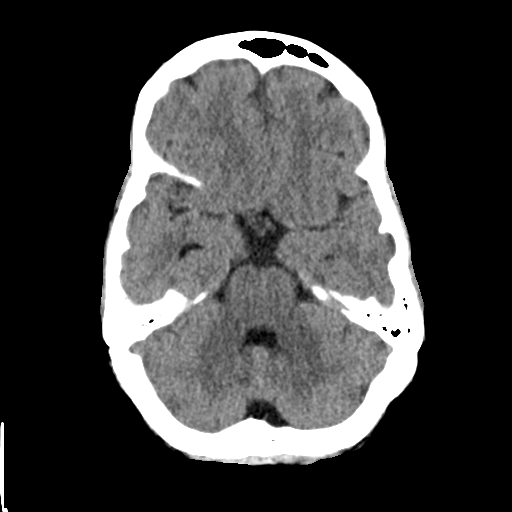
[im 13/33  brain]
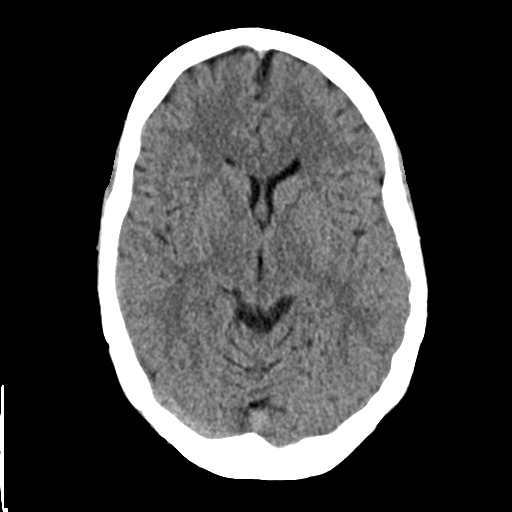
[im 17/33  brain]
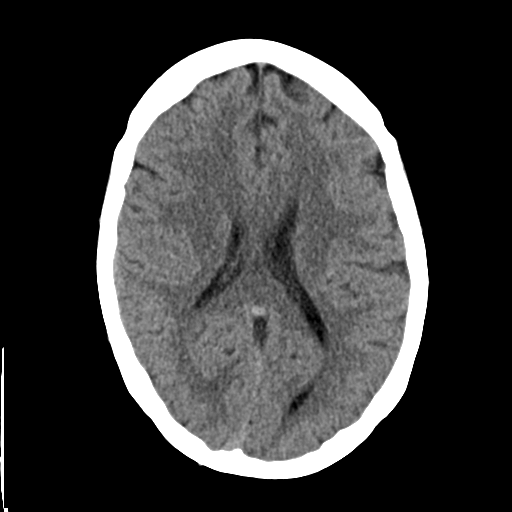
[im 21/33  brain]
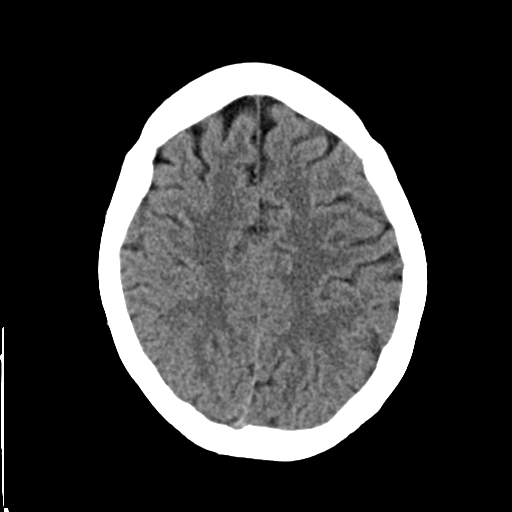
[im 21/33  bone]
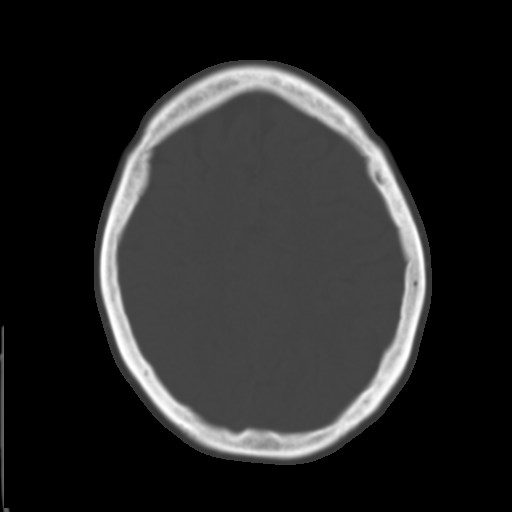
[im 25/33  brain]
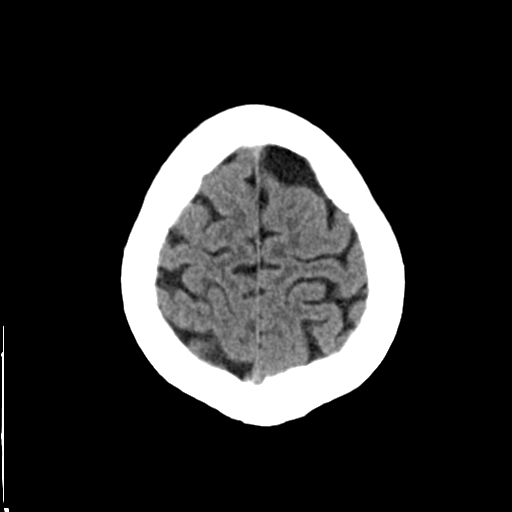
[im 29/33  brain]
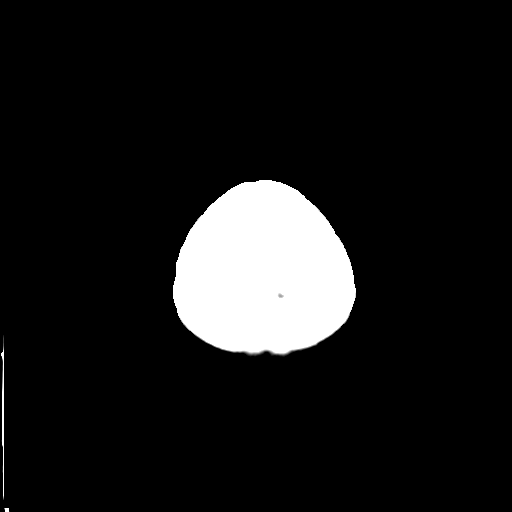

[Series 4: coronal soft tissue · coronal · 0.30mm/px · 3 of 68 slices shown]
[im 23/68  brain]
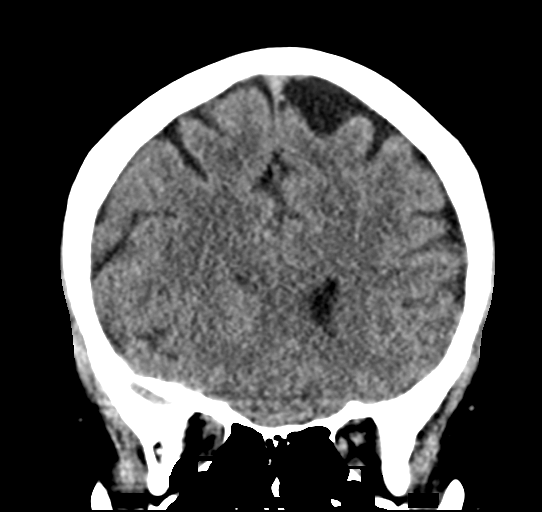
[im 30/68  brain]
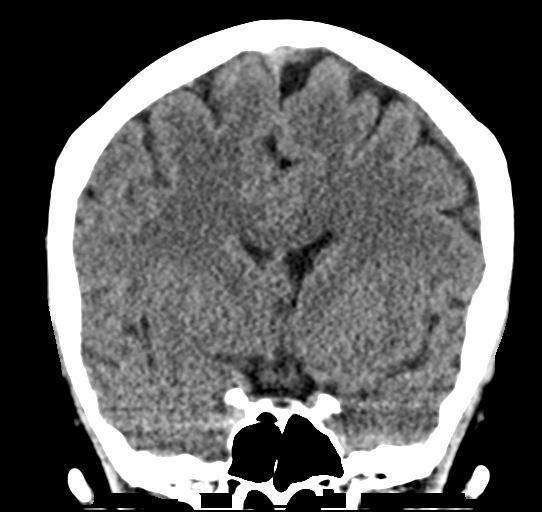
[im 38/68  brain]
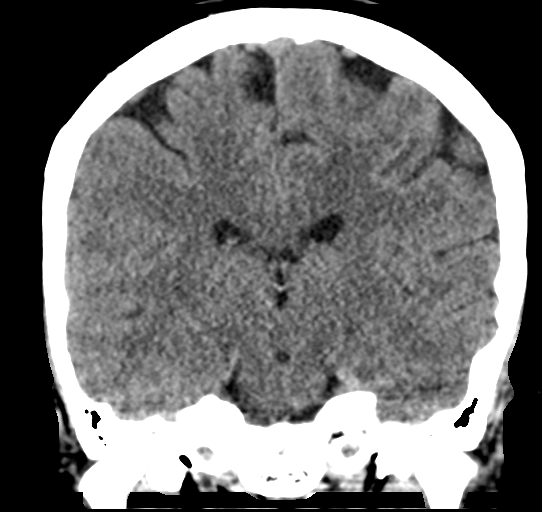

[Series 5: sagittal soft tissue · sagittal · 0.30mm/px · 3 of 53 slices shown]
[im 18/53  brain]
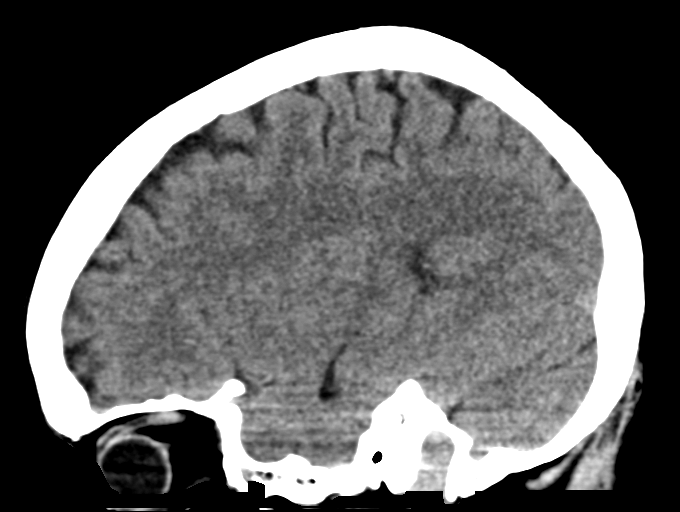
[im 27/53  brain]
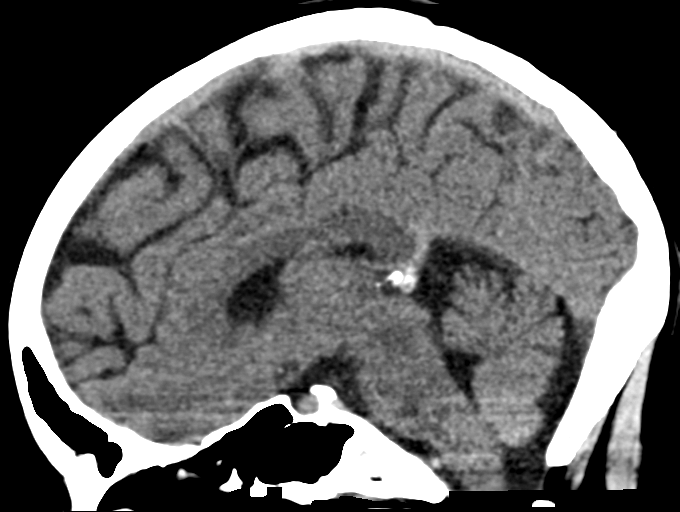
[im 35/53  brain]
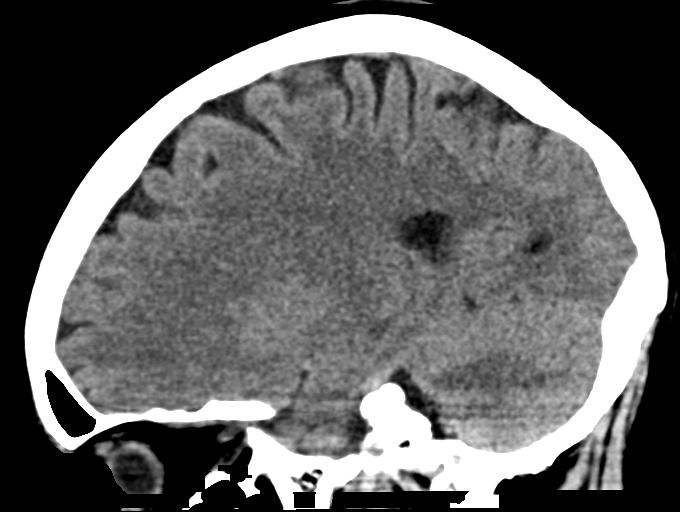

[16 of 47 positions shown; findings below may reference images not displayed]

FINDINGS: Brain:

Cerebral volume is normal.

There is no acute intracranial hemorrhage.

No demarcated cortical infarct.

No extra-axial fluid collection.

No evidence of an intracranial mass.

No midline shift.

Vascular: No hyperdense vessel.

Skull: Normal. Negative for fracture or focal lesion.

Sinuses/Orbits: Visualized orbits show no acute finding. Trace
mucosal thickening within the bilateral ethmoid sinuses.
IMPRESSION: No evidence of acute intracranial abnormality.

Minimal mucosal thickening within the bilateral ethmoid sinuses.

## 2021-12-08 IMAGING — MR MR HEAD W/O CM
11 series · 48 of 48 positions shown · non-contrast
Comparison: None.

CLINICAL DATA: Transient ischemic attack (TIA) Numbness and
tingling of hands right worse than left and jaw with lightheadedness

EXAM:
MRI HEAD WITHOUT CONTRAST
TECHNIQUE: Multiplanar, multiecho pulse sequences of the brain and surrounding
structures were obtained without intravenous contrast.

[Series 5: ax dwi_tracew · axial · 3.0mm · 0.71mm/px · z∈[-123,+29]mm · 5 of 56 slices shown]
[im 1/56]
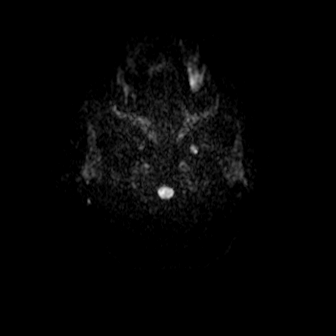
[im 14/56]
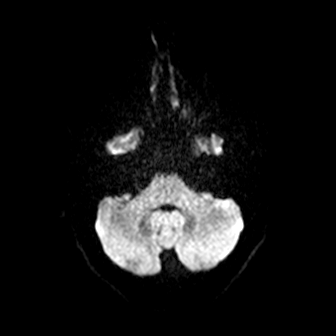
[im 28/56]
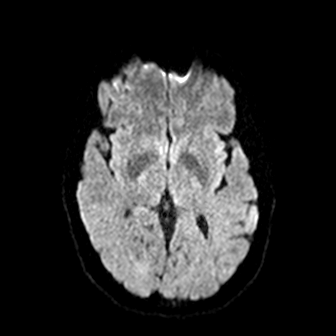
[im 42/56]
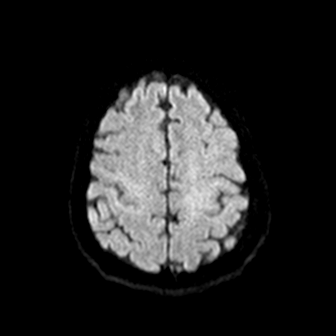
[im 56/56]
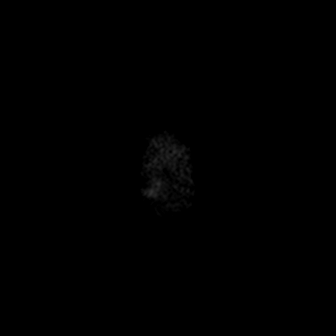

[Series 6: ax dwi_adc · axial · 3.0mm · 0.71mm/px · z∈[-123,+29]mm · 5 of 56 slices shown]
[im 1/56]
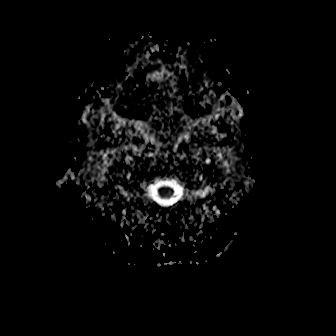
[im 14/56]
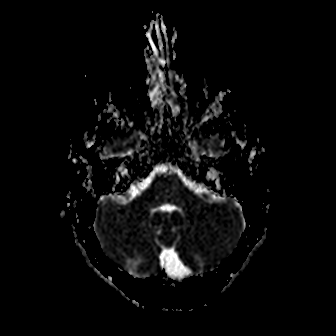
[im 28/56]
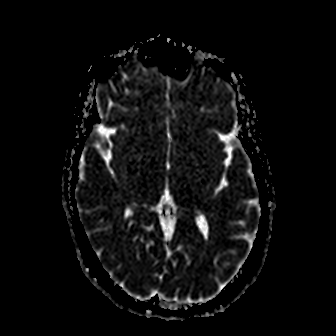
[im 42/56]
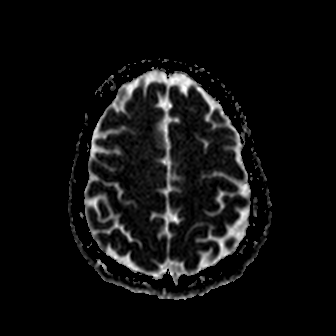
[im 56/56]
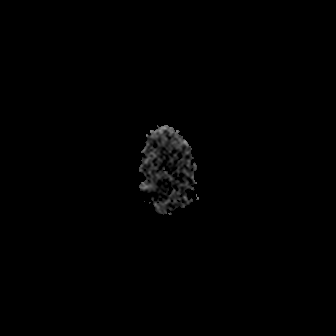

[Series 7: cor dwi_tracew · coronal · 5.0mm · 0.68mm/px · 3 of 40 slices shown]
[im 1/40]
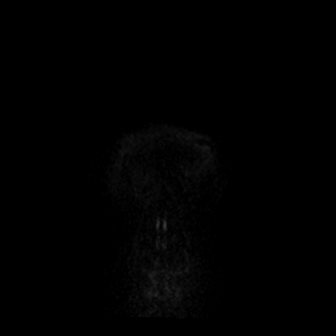
[im 20/40]
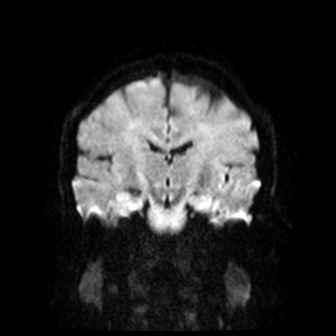
[im 40/40]
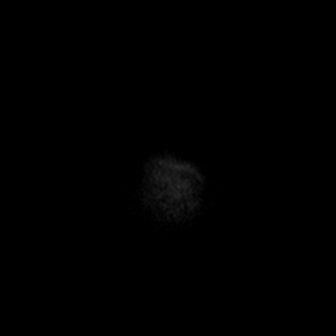

[Series 8: cor dwi_adc · coronal · 5.0mm · 0.68mm/px · 3 of 40 slices shown]
[im 1/40]
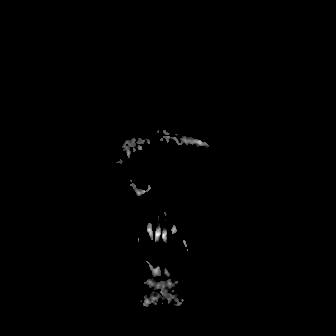
[im 20/40]
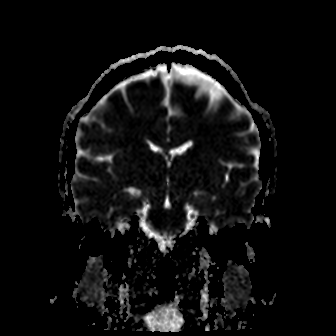
[im 40/40]
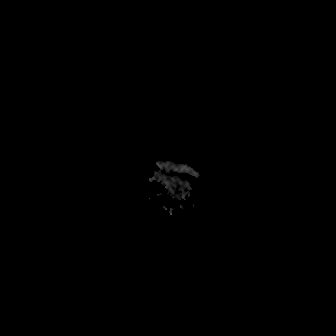

[Series 9: T1 · sagittal · 5.0mm · 0.47mm/px · 2 of 24 slices shown (1 of 2)]
[im 1/24]
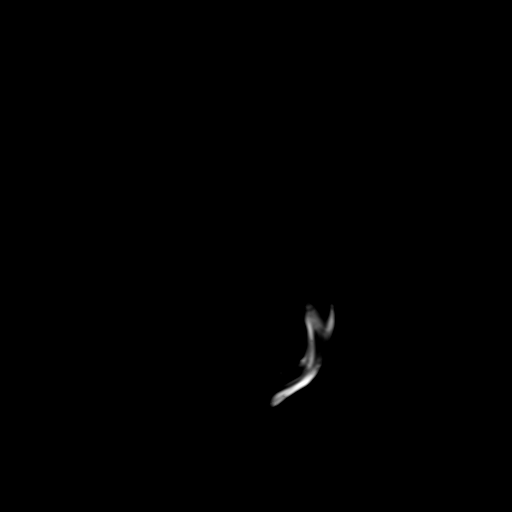
[im 24/24]
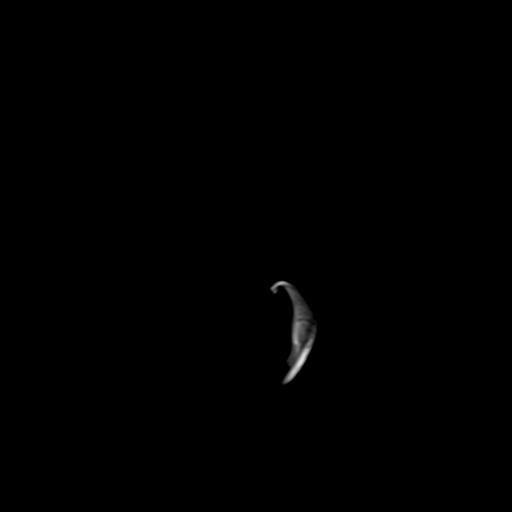

[Series 10: T2 · axial · 5.0mm · 0.86mm/px · z∈[-109,+34]mm · 2 of 27 slices shown (1 of 2)]
[im 1/27]
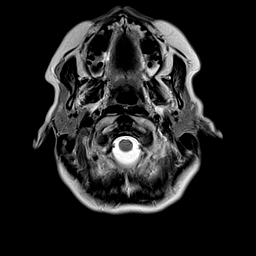
[im 27/27]
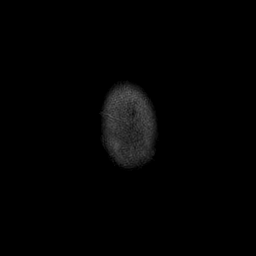

[Series 12: pha_images · axial · 3.0mm · 0.90mm/px · z∈[-109,+31]mm · 4 of 52 slices shown]
[im 1/52]
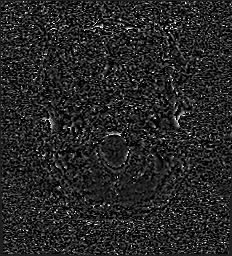
[im 18/52]
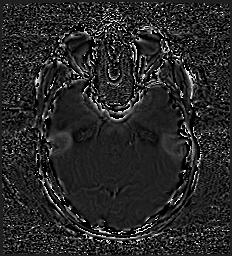
[im 35/52]
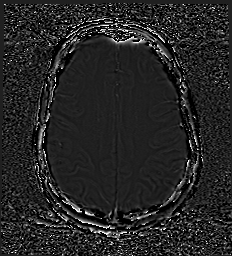
[im 52/52]
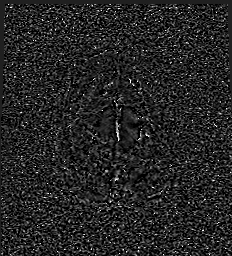

[Series 13: swi_images · axial · 3.0mm · 0.90mm/px · z∈[-109,+31]mm · 4 of 52 slices shown]
[im 1/52]
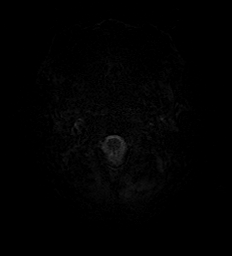
[im 18/52]
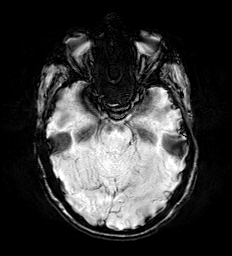
[im 35/52]
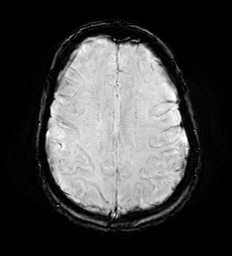
[im 52/52]
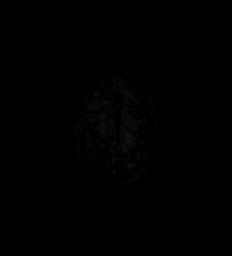

[Series 15: FLAIR · axial · 3.0mm · 0.69mm/px · z∈[-112,+37]mm · 4 of 55 slices shown]
[im 1/55]
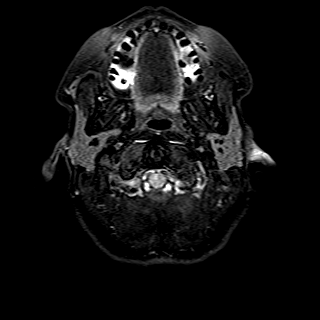
[im 19/55]
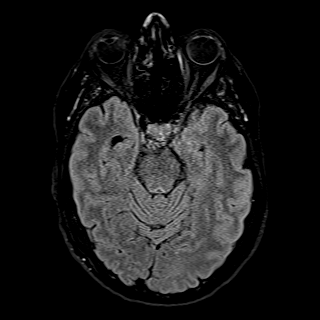
[im 37/55]
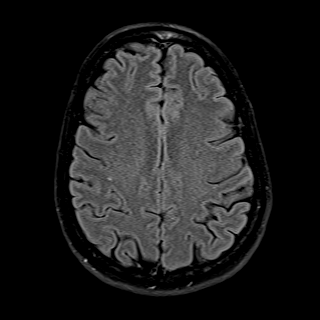
[im 55/55]
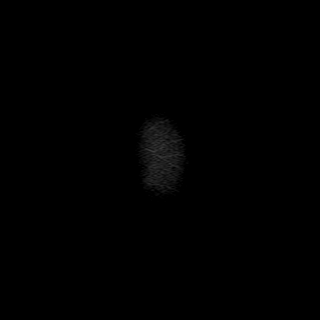

[Series 16: T1 · axial · 1.0mm · 0.98mm/px · z∈[-125,+35]mm · 14 of 176 slices shown (2 of 2)]
[im 1/176]
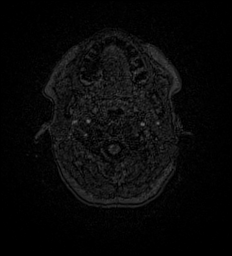
[im 14/176]
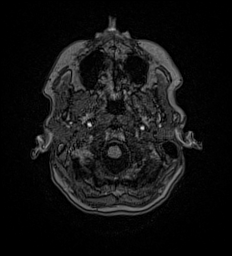
[im 27/176]
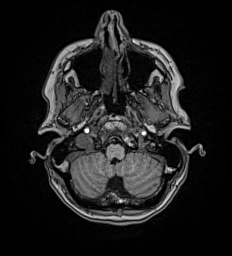
[im 41/176]
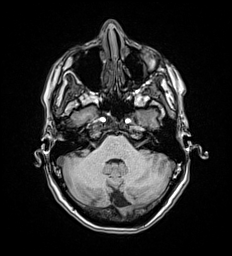
[im 54/176]
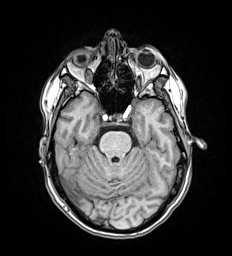
[im 68/176]
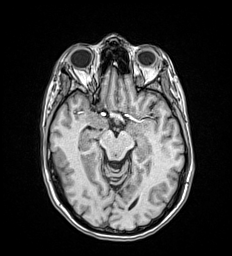
[im 81/176]
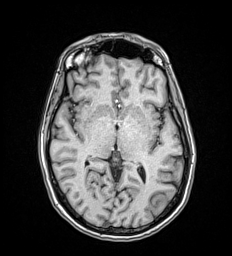
[im 95/176]
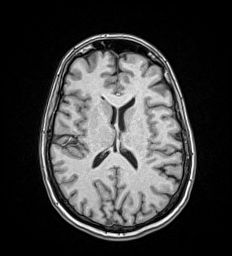
[im 108/176]
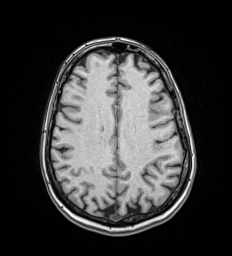
[im 122/176]
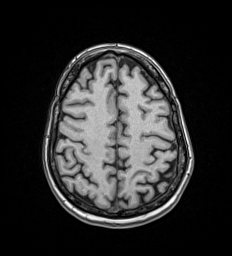
[im 135/176]
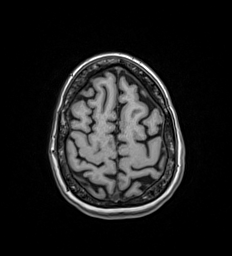
[im 149/176]
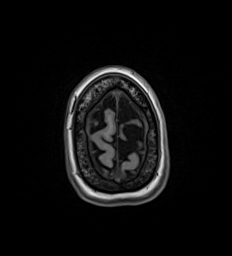
[im 162/176]
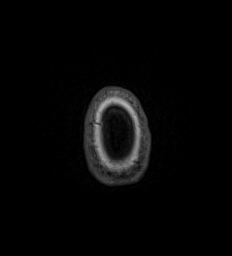
[im 176/176]
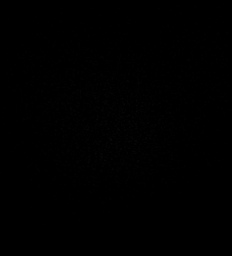

[Series 17: T2 · coronal · 5.0mm · 0.45mm/px · 2 of 31 slices shown (2 of 2)]
[im 1/31]
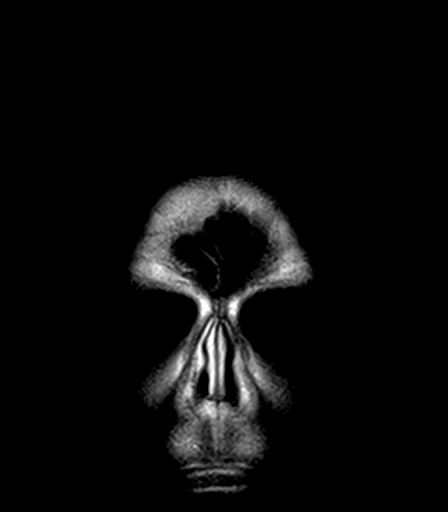
[im 31/31]
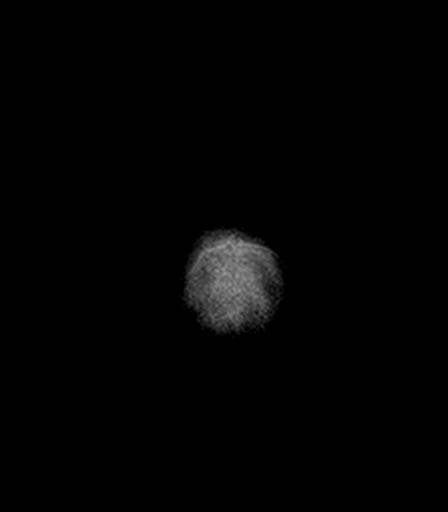

[48 of 48 positions shown; findings below may reference images not displayed]

FINDINGS: Brain: There is no acute infarction or intracranial hemorrhage.
There is no intracranial mass, mass effect, or edema. There is no
hydrocephalus or extra-axial fluid collection. Ventricles and sulci
are normal in size and configuration. Few punctate foci of T2
hyperintensity are present in the supratentorial white matter likely
reflecting nonspecific gliosis/demyelination of doubtful
significance.

Vascular: Major vessel flow voids at the skull base are preserved.

Skull and upper cervical spine: Normal marrow signal is preserved.

Sinuses/Orbits: Paranasal sinuses are aerated. Orbits are
unremarkable.

Other: Sella is unremarkable.  Mastoid air cells are clear.
IMPRESSION: No acute or significant intracranial abnormality.

## 2021-12-08 IMAGING — MR MR CERVICAL SPINE W/O CM
5 series · 39 of 48 positions shown · non-contrast
Comparison: None.

CLINICAL DATA: Neck pain, acute, known malignancy Numbness and
tingling of both hands right worse than left history of neck pain
and some neck discomfort now after injury 6 years ago known
arthritis possible nerve impingement

EXAM:
MRI CERVICAL SPINE WITHOUT CONTRAST
TECHNIQUE: Multiplanar, multisequence MR imaging of the cervical spine was
performed. No intravenous contrast was administered.

[Series 5: T2 · sagittal · 3.0mm · 0.62mm/px · 6 of 15 slices shown (1 of 2)]
[im 1/15]
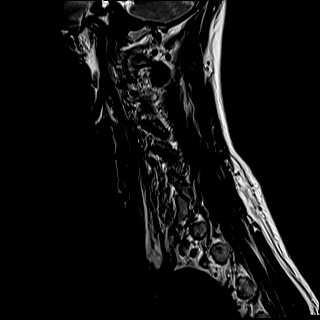
[im 3/15]
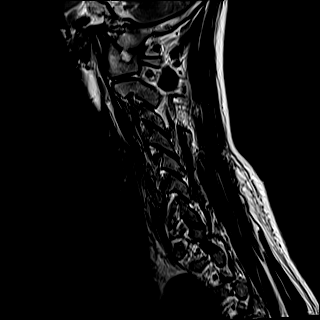
[im 6/15]
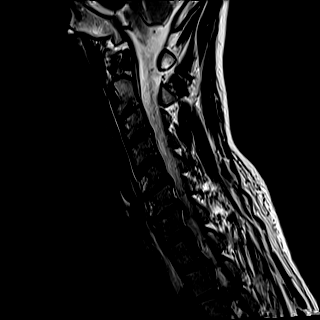
[im 9/15]
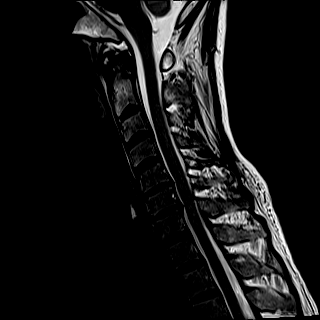
[im 12/15]
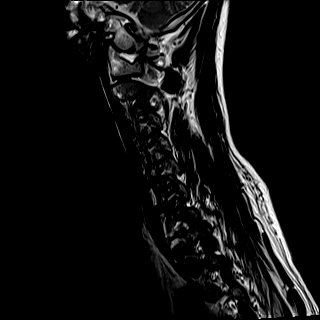
[im 15/15]
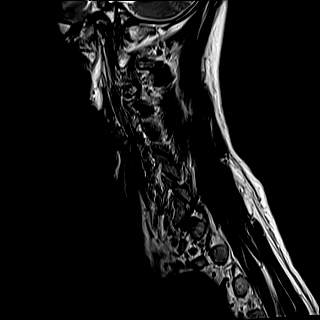

[Series 6: FLAIR · sagittal · 3.0mm · 0.78mm/px · 7 of 15 slices shown]
[im 1/15]
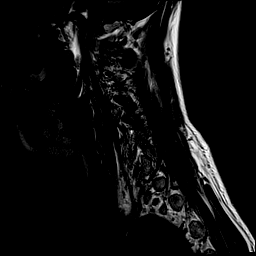
[im 3/15]
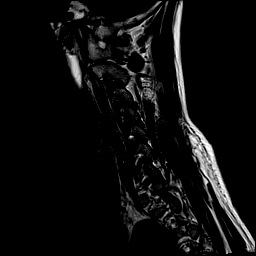
[im 5/15]
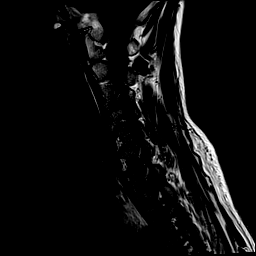
[im 8/15]
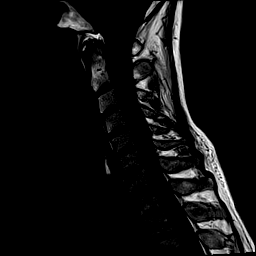
[im 10/15]
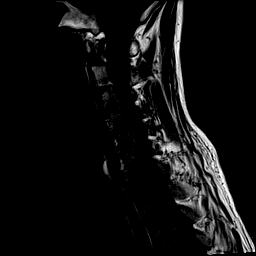
[im 12/15]
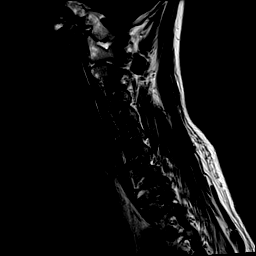
[im 15/15]
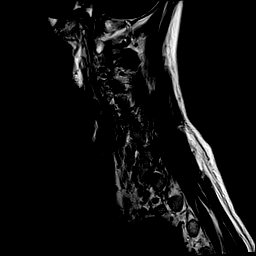

[Series 7: STIR · sagittal · 3.0mm · 0.62mm/px · 7 of 15 slices shown]
[im 1/15]
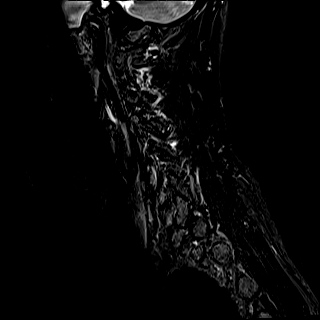
[im 3/15]
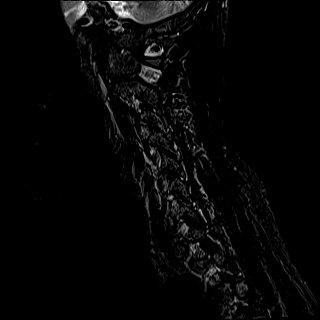
[im 5/15]
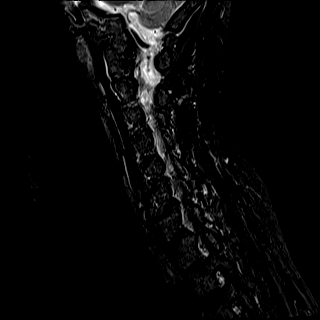
[im 8/15]
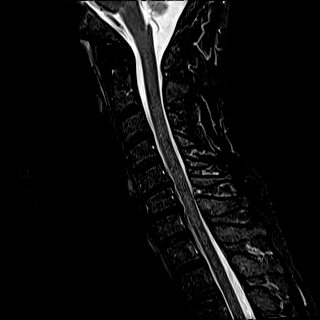
[im 10/15]
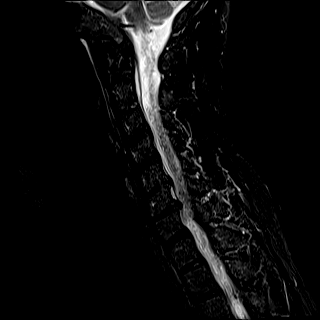
[im 12/15]
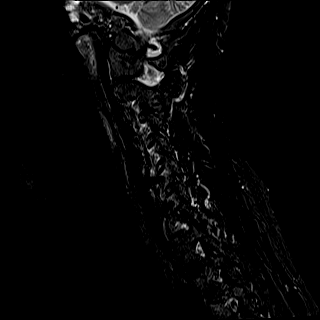
[im 15/15]
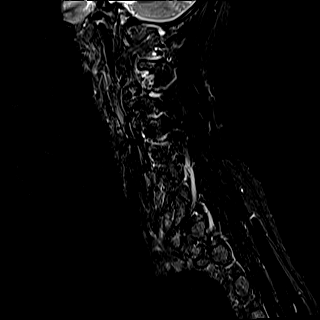

[Series 8: T2 · axial · 3.0mm · 0.70mm/px · z∈[-214,-124]mm · 11 of 29 slices shown (2 of 2)]
[im 1/29]
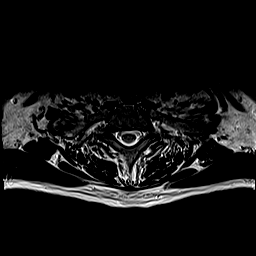
[im 3/29]
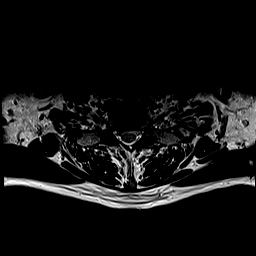
[im 5/29]
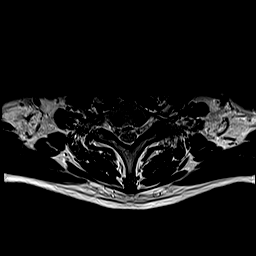
[im 7/29]
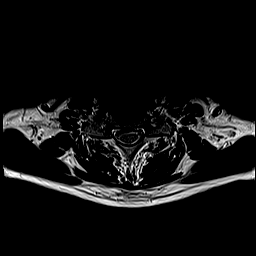
[im 9/29]
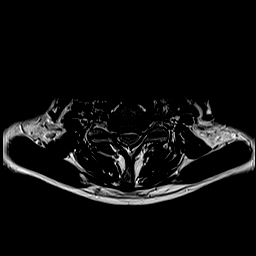
[im 11/29]
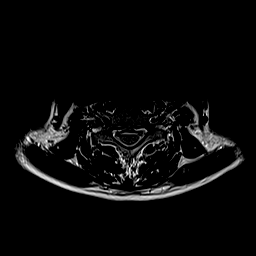
[im 13/29]
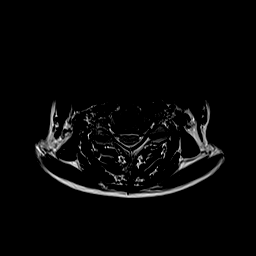
[im 16/29]
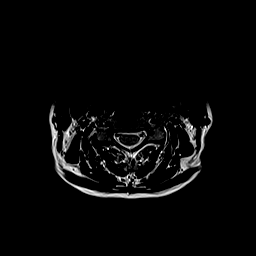
[im 20/29]
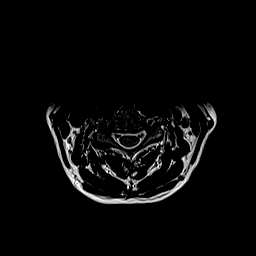
[im 24/29]
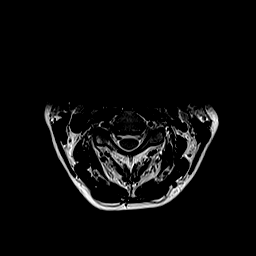
[im 29/29]
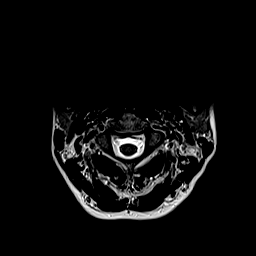

[Series 9: ax mpgr · axial · 3.0mm · 0.35mm/px · z∈[-214,-124]mm · 8 of 29 slices shown]
[im 1/29]
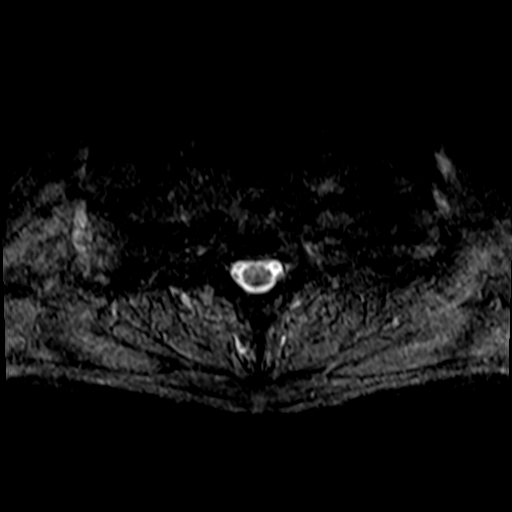
[im 5/29]
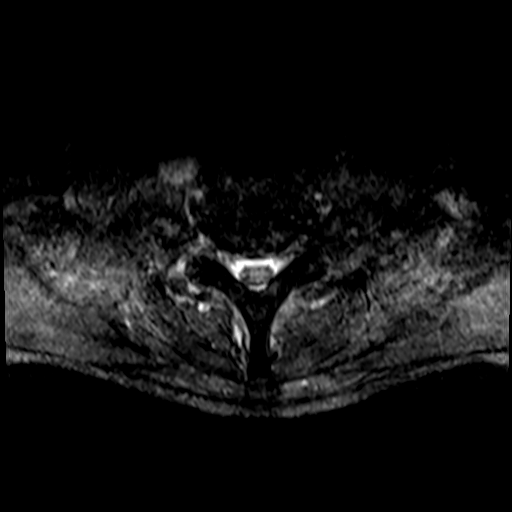
[im 9/29]
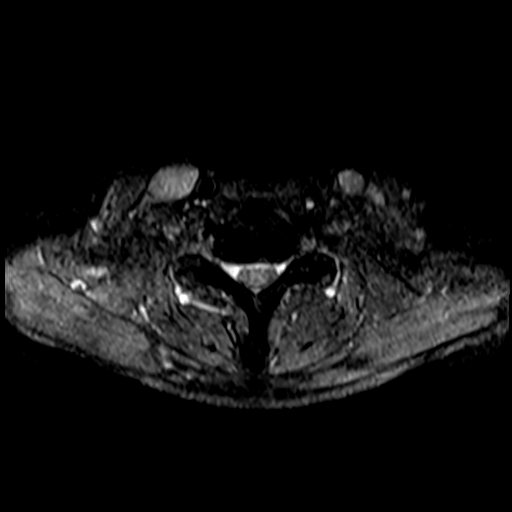
[im 13/29]
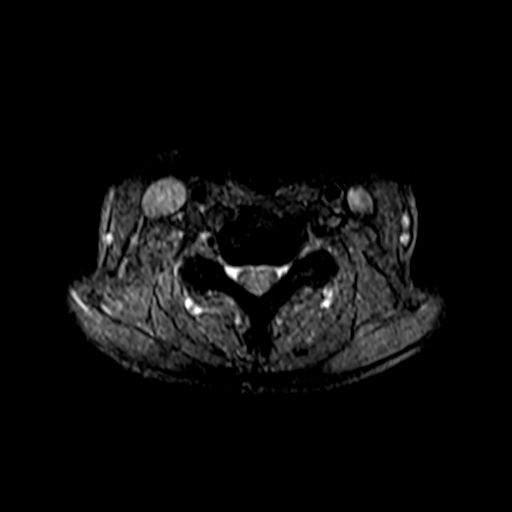
[im 16/29]
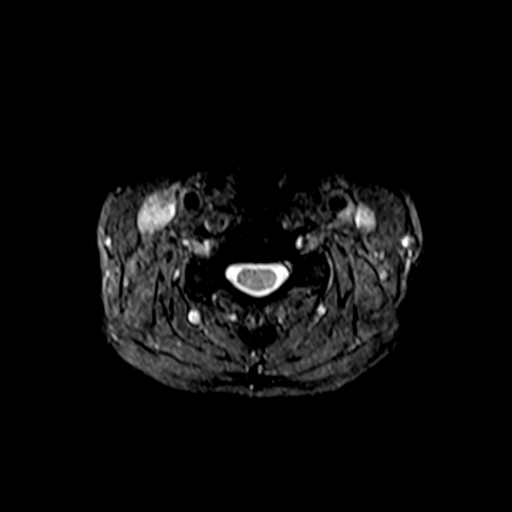
[im 20/29]
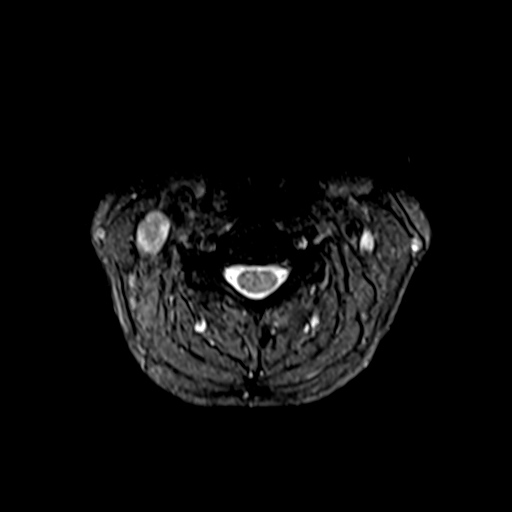
[im 24/29]
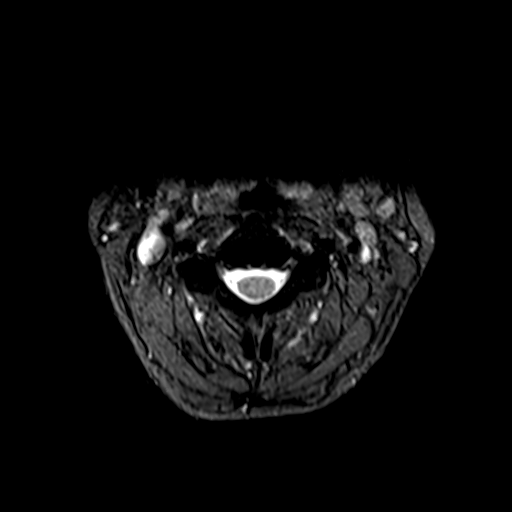
[im 29/29]
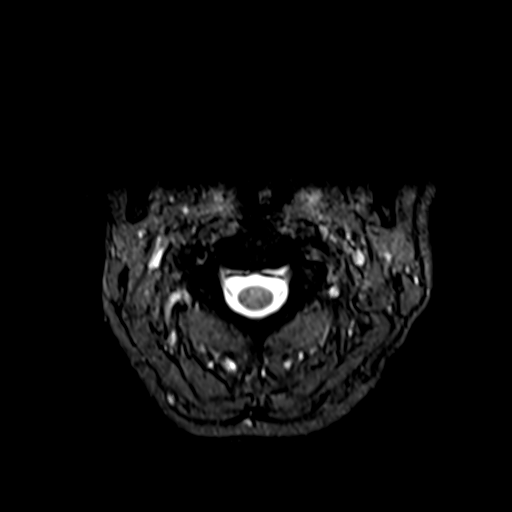

[39 of 48 positions shown; findings below may reference images not displayed]

FINDINGS: Alignment: Straightening of the cervical lordosis without static
listhesis.

Vertebrae: No fracture, evidence of discitis, or bone lesion.

Cord: Normal signal and morphology.

Posterior Fossa, vertebral arteries, paraspinal tissues: Negative.

Disc levels:

C2-C3: Unremarkable.

C3-C4: No disc protrusion. Mild right-sided facet arthropathy and
uncovertebral spurring resulting in mild right foraminal stenosis.
No canal stenosis.

C4-C5: Unremarkable disc. Minimal facet arthropathy. No foraminal or
canal stenosis.

C5-C6: Disc height loss with mild disc osteophyte complex and
bilateral uncovertebral spurring. Minimal facet arthropathy. Mild
bilateral foraminal stenosis. Borderline-mild canal stenosis.

C6-C7: Disc height loss with mild disc osteophyte complex and
bilateral uncovertebral spurring. No significant facet arthropathy.
Mild bilateral foraminal stenosis and mild canal stenosis.

C7-T1: Unremarkable.
IMPRESSION: 1. Mild cervical spondylosis most pronounced at the C5-6 and C6-7
levels where there is mild bilateral foraminal stenosis and mild
canal stenosis.
2. No acute osseous abnormality or abnormal cord signal.

## 2021-12-08 NOTE — ED Notes (Signed)
See triage note  presents with some numbness and dizziness  states this started today  grips equal

## 2021-12-08 NOTE — Discharge Instructions (Signed)
Please continue to follow-up with neurology and your primary care doctor.  Let them know your MRI was done.  You have some mild spinal stenosis at C5 through 7.  I discussed your case with Dr. Lacinda Axon the neurosurgeon.  He will follow you up as well.  If the spinal stenosis and foraminal stenosis is causing your numbness, which is possible, it may get worse as you get older.  This stenosis is very mild though and may not be causing the problem at all.  That is why we need to continue following up with your other doctors as well as the neurosurgeon.

## 2021-12-08 NOTE — ED Triage Notes (Signed)
Pt here with dizziness and numbness. Pt is scheduled for a MRI next week but was having dizziness and numbness today in her arms and hands, more on the right than left. Pt denies pain and N/V/D. Pt in NAD in triage.

## 2021-12-08 NOTE — ED Provider Notes (Signed)
[facility] Provider Note    Event Date/Time   First MD Initiated Contact with Patient 12/08/21 1205     (approximate)   History   Dizziness   HPI  Jorgina H Bodie is a 56 y.o. female who reports seeing her primary care doctor and neurology for bilateral hand numbness and tingling and some jaw numbness.  Apparently the jaw numbness was thought to be from possible shingles or herpes type I infection.  She had an MRI ordered but today she had increasing numbness and tingling which is now getting better and an episode of lightheadedness.  She is also had lightheadedness in the past.      Physical Exam   Triage Vital Signs: ED Triage Vitals [12/08/21 1142]  Enc Vitals Group     BP (!) 150/58     Pulse Rate 63     Resp 18     Temp 98.2 F (36.8 C)     Temp Source Oral     SpO2 99 %     Weight 170 lb (77.1 kg)     Height 5\' 9"  (1.753 m)     Head Circumference      Peak Flow      Pain Score 0     Pain Loc      Pain Edu?      Excl. in [place]?     Most recent vital signs: Vitals:   12/08/21 1417 12/08/21 1500  BP: 121/72 114/67  Pulse: (!) 58 64  Resp: 18 18  Temp:    SpO2: 100% 100%   General: Awake, alert, no distress.  Head normocephalic atraumatic Eyes: Pupils equal round reactive light extraocular movements intact fundi look normal bilaterally CV:  Good peripheral perfusion.  Heart regular rate and rhythm no audible murmurs Resp:  Normal effort.  Lungs are clear Abd:  No distention.  Soft bowel sounds positive nontender Neuro: Cranial nerves II through XII are intact although visual fields were not checked cerebellar finger-nose and rapid alternating movements and hands are normal motor strength is 5/5 throughout in all major muscle groups in the hands and arms and the legs.  Patient reports the numbness is almost gone.     ED Results / Procedures / Treatments   Labs (all labs ordered are listed, but only abnormal results are  displayed) Labs Reviewed  BASIC METABOLIC PANEL - Abnormal; Notable for the following components:      Result Value   Sodium 146 (*)    Glucose, Bld 107 (*)    Creatinine, Ser 1.06 (*)    All other components within normal limits  URINALYSIS, ROUTINE W REFLEX MICROSCOPIC - Abnormal; Notable for the following components:   Color, Urine STRAW (*)    APPearance CLEAR (*)    Specific Gravity, Urine 1.003 (*)    All other components within normal limits  CBC  CBG MONITORING, ED  TROPONIN I (HIGH SENSITIVITY)     EKG EKG read and interpreted by me shows normal sinus rhythm rate of 65 normal axis essentially normal EKG    Radiology: CT of the head does not show any intracranial pathology per radiology and my review.  There is some mild mucosal thickening in the ethmoid sinuses.  MRI of the brain does not show any intracranial pathology either.  MRI of the neck shows mild cervical spondylosis most pronounced at C5-6 and C6-7 with mild bilateral foraminal stenosis and mild canal stenosis.  I reviewed these films  as well.   PROCEDURES:  Critical Care performed:   Procedures   MEDICATIONS ORDERED IN ED: Medications - No data to display   IMPRESSION / MDM / [place] / ED COURSE  I reviewed the triage vital signs and the nursing notes. I discussed the patient with Dr. Lacinda Axon neurosurgery.  He will follow-up along with neurology and primary care.  Patient's troponin is negative her EKG is okay CT was good  MRI of the brain is normal which makes it unlikely that this is MS or stroke or any kind of intracranial pathology.  It still could be something from the herpes.  Patient's MRI of the neck shows some stenosis which could explain the numbness as well.  She will continue to follow-up with primary care and neurology and now neurosurgery to keep an eye on this she will return if she has any further problems.  Otherwise her lab work looks good she looks good and I think she will  be okay to go home.      FINAL CLINICAL IMPRESSION(S) / ED DIAGNOSES   Final diagnoses:  Arm numbness     Rx / DC Orders   ED Discharge Orders     None        Note:  This document was prepared using Dragon voice recognition software and may include unintentional dictation errors.   Nena Polio, MD 12/08/21 [phone]

## 2021-12-16 ENCOUNTER — Ambulatory Visit: Payer: 59

## 2022-09-24 ENCOUNTER — Other Ambulatory Visit: Payer: Self-pay | Admitting: Infectious Diseases

## 2022-09-24 DIAGNOSIS — Z1231 Encounter for screening mammogram for malignant neoplasm of breast: Secondary | ICD-10-CM

## 2022-10-17 ENCOUNTER — Ambulatory Visit
Admission: RE | Admit: 2022-10-17 | Discharge: 2022-10-17 | Disposition: A | Discharge status: Home / Self Care | Payer: 59 | Source: Ambulatory Visit | Attending: Infectious Diseases | Admitting: Infectious Diseases

## 2022-10-17 DIAGNOSIS — Z1231 Encounter for screening mammogram for malignant neoplasm of breast: Secondary | ICD-10-CM | POA: Insufficient documentation

## 2023-03-21 ENCOUNTER — Other Ambulatory Visit: Payer: Self-pay | Admitting: Obstetrics and Gynecology

## 2023-03-21 DIAGNOSIS — Z1231 Encounter for screening mammogram for malignant neoplasm of breast: Secondary | ICD-10-CM

## 2023-10-21 ENCOUNTER — Ambulatory Visit
Admission: RE | Admit: 2023-10-21 | Discharge: 2023-10-21 | Disposition: A | Discharge status: Home / Self Care | Payer: 59 | Source: Ambulatory Visit | Attending: Obstetrics and Gynecology | Admitting: Obstetrics and Gynecology

## 2023-10-21 DIAGNOSIS — Z1231 Encounter for screening mammogram for malignant neoplasm of breast: Secondary | ICD-10-CM | POA: Insufficient documentation

## 2024-03-24 ENCOUNTER — Other Ambulatory Visit: Payer: Self-pay | Admitting: Obstetrics and Gynecology

## 2024-03-24 DIAGNOSIS — Z1231 Encounter for screening mammogram for malignant neoplasm of breast: Secondary | ICD-10-CM

## 2024-10-28 ENCOUNTER — Ambulatory Visit
Admission: RE | Admit: 2024-10-28 | Discharge: 2024-10-28 | Disposition: A | Discharge status: Home / Self Care | Source: Ambulatory Visit | Attending: Obstetrics and Gynecology | Admitting: Obstetrics and Gynecology

## 2024-10-28 DIAGNOSIS — Z1231 Encounter for screening mammogram for malignant neoplasm of breast: Secondary | ICD-10-CM | POA: Diagnosis present

## 2024-11-27 ENCOUNTER — Other Ambulatory Visit: Payer: Self-pay | Admitting: Infectious Diseases

## 2024-11-27 DIAGNOSIS — Z1331 Encounter for screening for depression: Secondary | ICD-10-CM

## 2024-11-27 DIAGNOSIS — Z0000 Encounter for general adult medical examination without abnormal findings: Secondary | ICD-10-CM
